# Patient Record
Sex: Male | Born: 1955 | Race: White | Hispanic: No | Marital: Married | Smoking: Current every day smoker
Health system: Southern US, Community
[De-identification: ages and names within clinical notes are randomized; demographics above are authoritative.]

## PROBLEM LIST (undated history)

## (undated) DIAGNOSIS — N402 Nodular prostate without lower urinary tract symptoms: Secondary | ICD-10-CM

## (undated) DIAGNOSIS — I10 Essential (primary) hypertension: Secondary | ICD-10-CM

## (undated) HISTORY — DX: Essential (primary) hypertension: I10

## (undated) HISTORY — DX: Nodular prostate without lower urinary tract symptoms: N40.2

---

## 2001-09-29 HISTORY — PX: OTHER SURGICAL HISTORY: SHX169

## 2005-09-29 HISTORY — PX: PROSTATE SURGERY: SHX751

## 2006-07-15 ENCOUNTER — Ambulatory Visit: Payer: Self-pay | Admitting: Internal Medicine

## 2006-07-31 ENCOUNTER — Ambulatory Visit: Payer: Self-pay | Admitting: Internal Medicine

## 2006-10-15 ENCOUNTER — Ambulatory Visit: Payer: Self-pay | Admitting: Internal Medicine

## 2006-12-02 ENCOUNTER — Ambulatory Visit: Payer: Self-pay | Admitting: Internal Medicine

## 2006-12-23 ENCOUNTER — Encounter: Payer: Self-pay | Admitting: Internal Medicine

## 2006-12-23 ENCOUNTER — Ambulatory Visit: Payer: Self-pay | Admitting: Internal Medicine

## 2007-01-19 ENCOUNTER — Ambulatory Visit: Payer: Self-pay | Admitting: Internal Medicine

## 2007-01-19 LAB — CONVERTED CEMR LAB
BUN: 14 mg/dL (ref 6–23)
CO2: 30 meq/L (ref 19–32)
Calcium: 9.7 mg/dL (ref 8.4–10.5)
Creatinine, Ser: 1 mg/dL (ref 0.4–1.5)
GFR calc Af Amer: 101 mL/min

## 2007-03-09 ENCOUNTER — Telehealth (INDEPENDENT_AMBULATORY_CARE_PROVIDER_SITE_OTHER): Payer: Self-pay | Admitting: *Deleted

## 2007-04-05 ENCOUNTER — Encounter (INDEPENDENT_AMBULATORY_CARE_PROVIDER_SITE_OTHER): Payer: Self-pay | Admitting: *Deleted

## 2007-05-12 ENCOUNTER — Telehealth (INDEPENDENT_AMBULATORY_CARE_PROVIDER_SITE_OTHER): Payer: Self-pay | Admitting: *Deleted

## 2007-05-26 ENCOUNTER — Ambulatory Visit: Payer: Self-pay | Admitting: Internal Medicine

## 2007-05-26 DIAGNOSIS — I10 Essential (primary) hypertension: Secondary | ICD-10-CM | POA: Insufficient documentation

## 2007-05-28 ENCOUNTER — Telehealth (INDEPENDENT_AMBULATORY_CARE_PROVIDER_SITE_OTHER): Payer: Self-pay | Admitting: *Deleted

## 2007-10-15 ENCOUNTER — Ambulatory Visit: Payer: Self-pay | Admitting: Internal Medicine

## 2007-10-15 DIAGNOSIS — J309 Allergic rhinitis, unspecified: Secondary | ICD-10-CM | POA: Insufficient documentation

## 2007-10-18 LAB — CONVERTED CEMR LAB
ALT: 27 units/L (ref 0–53)
AST: 19 units/L (ref 0–37)
BUN: 14 mg/dL (ref 6–23)
Basophils Absolute: 0 10*3/uL (ref 0.0–0.1)
Calcium: 9.9 mg/dL (ref 8.4–10.5)
Chloride: 101 meq/L (ref 96–112)
Cholesterol: 211 mg/dL (ref 0–200)
Direct LDL: 117.6 mg/dL
Eosinophils Absolute: 0.3 10*3/uL (ref 0.0–0.6)
Eosinophils Relative: 4.7 % (ref 0.0–5.0)
GFR calc Af Amer: 90 mL/min
GFR calc non Af Amer: 74 mL/min
Glucose, Bld: 105 mg/dL — ABNORMAL HIGH (ref 70–99)
HDL: 59.8 mg/dL (ref >39.0)
Lymphocytes Relative: 30.4 % (ref 12.0–46.0)
MCV: 89.1 fL (ref 78.0–100.0)
Monocytes Relative: 7.6 % (ref 3.0–11.0)
Neutro Abs: 4 10*3/uL (ref 1.4–7.7)
Platelets: 205 10*3/uL (ref 150–400)
RBC: 5.01 M/uL (ref 4.22–5.81)
Triglycerides: 155 mg/dL — ABNORMAL HIGH (ref 0–149)
VLDL: 31 mg/dL (ref 0–40)
WBC: 6.9 10*3/uL (ref 4.5–10.5)

## 2008-01-31 ENCOUNTER — Encounter (INDEPENDENT_AMBULATORY_CARE_PROVIDER_SITE_OTHER): Payer: Self-pay | Admitting: *Deleted

## 2008-03-20 ENCOUNTER — Telehealth (INDEPENDENT_AMBULATORY_CARE_PROVIDER_SITE_OTHER): Payer: Self-pay | Admitting: *Deleted

## 2008-03-21 ENCOUNTER — Encounter: Payer: Self-pay | Admitting: Internal Medicine

## 2008-03-22 ENCOUNTER — Ambulatory Visit: Payer: Self-pay | Admitting: Internal Medicine

## 2008-03-22 DIAGNOSIS — M109 Gout, unspecified: Secondary | ICD-10-CM | POA: Insufficient documentation

## 2008-03-27 LAB — CONVERTED CEMR LAB: Uric Acid, Serum: 8.2 mg/dL — ABNORMAL HIGH (ref 4.0–7.8)

## 2008-12-27 ENCOUNTER — Encounter (INDEPENDENT_AMBULATORY_CARE_PROVIDER_SITE_OTHER): Payer: Self-pay | Admitting: *Deleted

## 2008-12-27 ENCOUNTER — Ambulatory Visit: Payer: Self-pay | Admitting: Internal Medicine

## 2009-01-04 LAB — CONVERTED CEMR LAB
ALT: 25 units/L (ref 0–53)
Alkaline Phosphatase: 50 units/L (ref 39–117)
BUN: 10 mg/dL (ref 6–23)
Basophils Relative: 0.8 % (ref 0.0–3.0)
Bilirubin, Direct: 0.2 mg/dL (ref 0.0–0.3)
Calcium: 9.6 mg/dL (ref 8.4–10.5)
Chloride: 100 meq/L (ref 96–112)
Creatinine, Ser: 0.8 mg/dL (ref 0.4–1.5)
Eosinophils Relative: 3.5 % (ref 0.0–5.0)
GFR calc non Af Amer: 106.93 mL/min (ref >60)
HDL: 56.5 mg/dL (ref >39.00)
LDL Cholesterol: 110 mg/dL — ABNORMAL HIGH (ref 0–99)
Lymphocytes Relative: 28.5 % (ref 12.0–46.0)
MCV: 89.1 fL (ref 78.0–100.0)
Monocytes Absolute: 0.6 10*3/uL (ref 0.1–1.0)
Neutrophils Relative %: 59.8 % (ref 43.0–77.0)
Platelets: 221 10*3/uL (ref 150.0–400.0)
RBC: 4.99 M/uL (ref 4.22–5.81)
TSH: 1.18 microintl units/mL (ref 0.35–5.50)
Total Bilirubin: 0.9 mg/dL (ref 0.3–1.2)
Total CHOL/HDL Ratio: 4
Triglycerides: 156 mg/dL — ABNORMAL HIGH (ref 0.0–149.0)
WBC: 7.5 10*3/uL (ref 4.5–10.5)

## 2009-01-10 ENCOUNTER — Telehealth (INDEPENDENT_AMBULATORY_CARE_PROVIDER_SITE_OTHER): Payer: Self-pay | Admitting: *Deleted

## 2009-01-31 ENCOUNTER — Ambulatory Visit: Payer: Self-pay | Admitting: Internal Medicine

## 2009-03-12 ENCOUNTER — Ambulatory Visit: Payer: Self-pay | Admitting: Internal Medicine

## 2009-03-12 DIAGNOSIS — M79609 Pain in unspecified limb: Secondary | ICD-10-CM | POA: Insufficient documentation

## 2009-03-13 ENCOUNTER — Telehealth (INDEPENDENT_AMBULATORY_CARE_PROVIDER_SITE_OTHER): Payer: Self-pay | Admitting: *Deleted

## 2010-02-12 ENCOUNTER — Telehealth (INDEPENDENT_AMBULATORY_CARE_PROVIDER_SITE_OTHER): Payer: Self-pay | Admitting: *Deleted

## 2010-03-08 ENCOUNTER — Ambulatory Visit: Payer: Self-pay | Admitting: Internal Medicine

## 2010-03-11 ENCOUNTER — Encounter (INDEPENDENT_AMBULATORY_CARE_PROVIDER_SITE_OTHER): Payer: Self-pay | Admitting: *Deleted

## 2010-03-26 ENCOUNTER — Encounter (INDEPENDENT_AMBULATORY_CARE_PROVIDER_SITE_OTHER): Payer: Self-pay | Admitting: *Deleted

## 2010-03-27 ENCOUNTER — Ambulatory Visit: Payer: Self-pay | Admitting: Internal Medicine

## 2010-03-29 LAB — CONVERTED CEMR LAB
AST: 20 units/L (ref 0–37)
Basophils Relative: 0.7 % (ref 0.0–3.0)
CO2: 31 meq/L (ref 19–32)
Chloride: 105 meq/L (ref 96–112)
Cholesterol: 204 mg/dL — ABNORMAL HIGH (ref 0–200)
Eosinophils Absolute: 0.3 10*3/uL (ref 0.0–0.7)
Glucose, Bld: 100 mg/dL — ABNORMAL HIGH (ref 70–99)
HCT: 44.8 % (ref 39.0–52.0)
Hemoglobin: 15.3 g/dL (ref 13.0–17.0)
Lymphs Abs: 2.3 10*3/uL (ref 0.7–4.0)
MCHC: 34.1 g/dL (ref 30.0–36.0)
MCV: 91.6 fL (ref 78.0–100.0)
Monocytes Absolute: 0.5 10*3/uL (ref 0.1–1.0)
Neutro Abs: 3.8 10*3/uL (ref 1.4–7.7)
RBC: 4.9 M/uL (ref 4.22–5.81)
Sodium: 143 meq/L (ref 135–145)
Total CHOL/HDL Ratio: 3

## 2010-04-16 ENCOUNTER — Encounter (INDEPENDENT_AMBULATORY_CARE_PROVIDER_SITE_OTHER): Payer: Self-pay | Admitting: *Deleted

## 2010-04-19 ENCOUNTER — Telehealth (INDEPENDENT_AMBULATORY_CARE_PROVIDER_SITE_OTHER): Payer: Self-pay | Admitting: *Deleted

## 2010-10-27 LAB — CONVERTED CEMR LAB
ALT: 36 units/L (ref 0–40)
AST: 25 units/L (ref 0–37)
BUN: 10 mg/dL (ref 6–23)
Calcium: 10 mg/dL (ref 8.4–10.5)
Chloride: 102 meq/L (ref 96–112)
HCT: 46.6 % (ref 39.0–52.0)
Hemoglobin: 15.9 g/dL (ref 13.0–17.0)
MCHC: 34.1 g/dL (ref 30.0–36.0)
MCV: 91.1 fL (ref 78.0–100.0)
Sodium: 139 meq/L (ref 135–145)
TSH: 1.11 microintl units/mL (ref 0.35–5.50)
Total Bilirubin: 1.1 mg/dL (ref 0.3–1.2)
Total Protein: 6.9 g/dL (ref 6.0–8.3)
VLDL: 32 mg/dL (ref 0–40)
WBC: 5.9 10*3/uL (ref 4.5–10.5)

## 2010-10-29 NOTE — Letter (Signed)
Summary: [facility] Instructions  [facility]  [address] [place], [place] [address]   Phone: [phone]  Fax: [phone]       BRONTE KROPF    [birth year]    MRN: [id number]        Procedure Day Dorna Bloom:  Farrell Ours  05/03/10     Arrival Time:  9:00am     Procedure Time:  10:00am     Location of Procedure:                    Juliann Pares _  [facility] (4th Floor)                        PREPARATION FOR COLONOSCOPY WITH MOVIPREP   Starting 5 days prior to your procedure  SUNDAY 07/31  do not eat nuts, seeds, popcorn, corn, beans, peas,  salads, or any raw vegetables.  Do not take any fiber supplements (e.g. Metamucil, Citrucel, and Benefiber).  THE DAY BEFORE YOUR PROCEDURE         DATE: THURSDAY  08/04  1.  Drink clear liquids the entire day-NO SOLID FOOD  2.  Do not drink anything colored red or purple.  Avoid juices with pulp.  No orange juice.  3.  Drink at least 64 oz. (8 glasses) of fluid/clear liquids during the day to prevent dehydration and help the prep work efficiently.  CLEAR LIQUIDS INCLUDE: Water Jello Ice Popsicles Tea (sugar ok, no milk/cream) Powdered fruit flavored drinks Coffee (sugar ok, no milk/cream) Gatorade Juice: apple, white grape, white cranberry  Lemonade Clear bullion, consomm, broth Carbonated beverages (any kind) Strained chicken noodle soup Hard Candy                             4.  In the morning, mix first dose of MoviPrep solution:    Empty 1 Pouch A and 1 Pouch B into the disposable container    Add lukewarm drinking water to the top line of the container. Mix to dissolve    Refrigerate (mixed solution should be used within 24 hrs)  5.  Begin drinking the prep at 5:00 p.m. The MoviPrep container is divided by 4 marks.   Every 15 minutes drink the solution down to the next mark (approximately 8 oz) until the full liter is complete.   6.  Follow completed prep with 16 oz of clear liquid of your choice  (Nothing red or purple).  Continue to drink clear liquids until bedtime.  7.  Before going to bed, mix second dose of MoviPrep solution:    Empty 1 Pouch A and 1 Pouch B into the disposable container    Add lukewarm drinking water to the top line of the container. Mix to dissolve    Refrigerate  THE DAY OF YOUR PROCEDURE      DATE: FRIDAY  08/05  Beginning at  5:00 a.m. (5 hours before procedure):         1. Every 15 minutes, drink the solution down to the next mark (approx 8 oz) until the full liter is complete.  2. Follow completed prep with 16 oz. of clear liquid of your choice.    3. You may drink clear liquids until 8:00am (2 HOURS BEFORE PROCEDURE).   MEDICATION INSTRUCTIONS  Unless otherwise instructed, you should take regular prescription medications with a small sip of water   as early as  possible the morning of your procedure.  Diabetic patients - see separate instructions.  Stop taking Plavix or Aggrenox on  _  _  (7 days before procedure).     Stop taking Coumadin on  _ _  (5 days before procedure).  Additional medication instructions: _         OTHER INSTRUCTIONS  You will need a responsible adult at least 57 years of age to accompany you and drive you home.   This person must remain in the waiting room during your procedure.  Wear loose fitting clothing that is easily removed.  Leave jewelry and other valuables at home.  However, you may wish to bring a book to read or  an iPod/MP3 player to listen to music as you wait for your procedure to start.  Remove all body piercing jewelry and leave at home.  Total time from sign-in until discharge is approximately 2-3 hours.  You should go home directly after your procedure and rest.  You can resume normal activities the  day after your procedure.  The day of your procedure you should not:   Drive   Make legal decisions   Operate machinery   Drink alcohol   Return to work  You will receive  specific instructions about eating, activities and medications before you leave.    The above instructions have been reviewed and explained to me by  Patient was a NOS. _______________________Suzanne Yetta Flock, RN    I fully understand and can verbalize these instructions _____________________________ Date _________

## 2010-10-29 NOTE — Letter (Signed)
Summary: Primary Care Consult Scheduled Letter  [facility]  [address] [place], [place] [address]   Phone: [phone]  Fax: [phone]      03/26/2010 MRN: [id number]  Clifford Dean [address] [place], [place]  [address]    Dear [name],      We have scheduled an appointment for you.  At the recommendation of Dr.Paz, we have scheduled you a consult with [facility] located on [address], [place], [place] on the 3rd floor. Your previsit with the nurse will be on 04-19-10 at 9:30am. The actual procedure will be on 05-03-10 @ 10am. You will need to arrive 1 hour early on this day. The doctor will be Dr. Russella Dar. If this appointment day and time is not convenient for you, please feel free to call the office at [phone] reschedule the appointment.     It is important for you to keep your scheduled appointments. We are here to make sure you are given good patient care. If you have questions or you have made changes to your appointment, please notify us at  [phone] ext [phone] , ask for Marisue Ivan.    Thank you,  Patient Care Coordinator [place] [facility]

## 2010-10-29 NOTE — Assessment & Plan Note (Signed)
Summary: CPX/CBS   Vital Signs:  Patient profile:   55 year old male Height:      69 inches Weight:      206 pounds BMI:     30.53 Pulse rate:   56 / minute BP sitting:   136 / 86  (left arm)  Vitals Entered By: Doristine Devoid (March 08, 2010 3:04 PM) CC: CPX    History of Present Illness: CPX  Allergies: No Known Drug Allergies  Past History:  Past Medical History: Reviewed history from 03/22/2008 and no changes required. Hypertension Allergic rhinitis Cardiolite neg 2003 Gout  Past Surgical History: Reviewed history from 10/15/2007 and no changes required. Prostate nodule: Prostate Bx (-) 2007  Family History: Reviewed history from 10/15/2007 and no changes required. 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 CV:  Denies chest pain or discomfort, palpitations, and swelling of feet; ambulatory BP around 120-140/75-90. Resp:  Denies cough and wheezing. GI:  Denies bloody stools, diarrhea, nausea, and vomiting. Psych:  Denies anxiety and depression.  Physical Exam  General:  alert, well-developed, and well-nourished.   Neck:  no masses, no thyromegaly, and normal carotid upstroke.   Lungs:  normal respiratory effort, no intercostal retractions, no accessory muscle use, and normal breath sounds.   Heart:  normal rate, regular rhythm, no murmur, and no gallop.   Abdomen:  soft, non-tender, normal bowel sounds, no distention, no masses, no guarding, and no rigidity.   Extremities:  no lower extremity edema Psych:  Oriented X3, memory intact for recent and remote, normally interactive, good eye contact, not anxious appearing, and not depressed appearing.     Impression &  Recommendations:  Problem # 1:  HEALTH SCREENING (ICD-V70.0)  Td 2003 never had a Cscope, thinking about having a colonoscopy in few months. We'll arrange a referral to GI Diet and exercise discussed labs PSA per urology   Orders: Gastroenterology Referral (GI)  Problem # 2:  HYPERTENSION (ICD-401.9) off Maxide  for  few months due to a history of gout. Ambulatory BPs satisfactory so far The following medications were removed from the medication list:    Maxzide-25 37.5-25 Mg Tabs (Triamterene-hctz) ..... Half by mouth once daily His updated medication list for this problem includes:    Lopressor 100 Mg Tabs (Metoprolol tartrate) .Marland Kitchen... 1 by mouth two times a day    Felodipine 10 Mg Tb24 (Felodipine) .Marland Kitchen... 1 by mouth once daily  BP today: 136/86 Prior BP: 120/80 (03/12/2009)  Labs Reviewed: K+: 3.9 (12/27/2008) Creat: : 0.8 (12/27/2008)   Chol: 198 (12/27/2008)   HDL: 56.50 (12/27/2008)   LDL: 110 (12/27/2008)   TG: 156.0 (12/27/2008)  Complete Medication List: 1)  Lopressor 100 Mg Tabs (Metoprolol tartrate) .Marland Kitchen.. 1 by mouth two times a day 2)  Felodipine 10 Mg Tb24 (Felodipine) .Marland Kitchen.. 1 by mouth once daily 3)  Baby Aspirin 81 Mg Chew (Aspirin) .Marland Kitchen.. 1  a day 4)  Indomethacin 50 Mg Caps (Indomethacin) .Marland Kitchen.. 1 by mouth three times a day as needed gout - 5)  Locoid Lipocream 0.1 % Crea (Hydrocortisone butyr lipo base) .... Per derm  Patient Instructions: 1)  come back fasting 2)  FLP, BMP, CBC,TSH, AST, ALT, uric acid  Dx V70 3)  Check your blood pressure 2 or 3 times a week. If it is more than 140/85 consistently,please let us know  4)  Please schedule a follow-up appointment in 1 year.  Prescriptions: FELODIPINE 10 MG  TB24 (FELODIPINE) 1 by mouth once daily  #90 x 3   Entered and Authorized by:   Elita Quick E. Paz MD   Signed by:   Nolon Rod. Paz MD on 03/08/2010   Method used:   Electronically to        [facility]  [phone]* (retail)       [address] [place], [place]  [address]       Ph: [phone] or [phone]       Fax: [phone]   RxID:   [id number] LOPRESSOR 100 MG  TABS (METOPROLOL TARTRATE) 1 by mouth two times a day  #180 x 3   Entered and Authorized by:   Nolon Rod. Paz MD   Signed by:   Nolon Rod. Paz MD on 03/08/2010   Method used:   Electronically to        [facility]  [phone]* (retail)       [address] [place], [place]  [address]       Ph: [phone] or [phone]       Fax: [phone]   RxID:   [id number]

## 2010-10-29 NOTE — Letter (Signed)
Summary: Previsit letter  [facility]  [address] [place], [place] [address]   Phone: [phone]  Fax: [phone]       03/11/2010 MRN: [id number]  Clifford Dean [address] [place], [place]  [address]  Dear Mr. CUARESMA,  Welcome to the Gastroenterology Division at [facility].    You are scheduled to see a nurse for your pre-procedure visit on 04-19-10 at 9:30am on the 3rd floor at [facility], [address]  We ask that you try to arrive at our office 15 minutes prior to your appointment time to allow for check-in.  Your nurse visit will consist of discussing your medical and surgical history, your immediate family medical history, and your medications.    Please bring a complete list of all your medications or, if you prefer, bring the medication bottles and we will list them.  We will need to be aware of both prescribed and over the counter drugs.  We will need to know exact dosage information as well.  If you are on blood thinners (Coumadin, Plavix, Aggrenox, Ticlid, etc.) please call our office today/prior to your appointment, as we need to consult with your physician about holding your medication.   Please be prepared to read and sign documents such as consent forms, a financial agreement, and acknowledgement forms.  If necessary, and with your consent, a friend or relative is welcome to sit-in on the nurse visit with you.  Please bring your insurance card so that we may make a copy of it.  If your insurance requires a referral to see a specialist, please bring your referral form from your primary care physician.  No co-pay is required for this nurse visit.     If you cannot keep your appointment, please call [phone] to cancel or reschedule prior to your appointment date.  This allows [place] the opportunity to schedule an appointment for another patient in need of care.    Thank you for choosing [facility] for your medical needs.  We  appreciate the opportunity to care for you.  Please visit [place] at our website  to learn more about our practice.                     Sincerely.                                                                                                                   The Gastroenterology Division

## 2010-10-29 NOTE — Progress Notes (Signed)
Summary: FYI -pt has appt for cpx [id number]  Phone Note Call from Patient   Summary of Call: PATIENT HAS APPT FOR CPX - [id number] Initial call taken by: Okey Regal [place],  Feb 12, 2010 9:01 AM

## 2010-10-29 NOTE — Progress Notes (Signed)
  Phone Note Outgoing Call   Call placed by: Clide Cliff RN,  April 19, 2010 10:01 AM Details for Reason: NOS for previsit Summary of Call: Called pt. on cell phone and left a message to call me if he wanted to reschedule his previsit.  Informed him that I would have to cancel his colonoscopy if a previsit was not completed.  Spoke with wife at 4:00pm and she said to "just cancel it.  He'll call back when he's ready." Initial call taken by: Clide Cliff RN,  April 19, 2010 10:02 AM

## 2011-03-04 ENCOUNTER — Other Ambulatory Visit: Payer: Self-pay | Admitting: Internal Medicine

## 2011-03-05 ENCOUNTER — Other Ambulatory Visit: Payer: Self-pay | Admitting: Internal Medicine

## 2011-03-11 ENCOUNTER — Encounter: Payer: Self-pay | Admitting: Internal Medicine

## 2011-03-12 ENCOUNTER — Ambulatory Visit (INDEPENDENT_AMBULATORY_CARE_PROVIDER_SITE_OTHER): Payer: BC Managed Care – PPO | Admitting: Internal Medicine

## 2011-03-12 ENCOUNTER — Encounter: Payer: Self-pay | Admitting: Internal Medicine

## 2011-03-12 DIAGNOSIS — M109 Gout, unspecified: Secondary | ICD-10-CM

## 2011-03-12 DIAGNOSIS — Z0000 Encounter for general adult medical examination without abnormal findings: Secondary | ICD-10-CM | POA: Insufficient documentation

## 2011-03-12 DIAGNOSIS — I10 Essential (primary) hypertension: Secondary | ICD-10-CM

## 2011-03-12 LAB — CBC WITH DIFFERENTIAL/PLATELET
Basophils Absolute: 0.1 10*3/uL (ref 0.0–0.1)
Eosinophils Absolute: 0.4 10*3/uL (ref 0.0–0.7)
HCT: 44.8 % (ref 39.0–52.0)
Lymphs Abs: 2.6 10*3/uL (ref 0.7–4.0)
MCV: 91.5 fl (ref 78.0–100.0)
Monocytes Absolute: 0.5 10*3/uL (ref 0.1–1.0)
Neutrophils Relative %: 51.5 % (ref 43.0–77.0)
Platelets: 218 10*3/uL (ref 150.0–400.0)
RDW: 13.5 % (ref 11.5–14.6)

## 2011-03-12 LAB — LDL CHOLESTEROL, DIRECT: Direct LDL: 122.5 mg/dL

## 2011-03-12 LAB — AST: AST: 25 U/L (ref 0–37)

## 2011-03-12 LAB — LIPID PANEL
Cholesterol: 209 mg/dL — ABNORMAL HIGH (ref 0–200)
HDL: 60.7 mg/dL (ref >39.00)
Total CHOL/HDL Ratio: 3
Triglycerides: 157 mg/dL — ABNORMAL HIGH (ref 0.0–149.0)

## 2011-03-12 LAB — BASIC METABOLIC PANEL
Calcium: 9.7 mg/dL (ref 8.4–10.5)
Creatinine, Ser: 0.8 mg/dL (ref 0.4–1.5)
GFR: 101.66 mL/min (ref >60.00)
Sodium: 139 mEq/L (ref 135–145)

## 2011-03-12 LAB — TSH: TSH: 1.26 u[IU]/mL (ref 0.35–5.50)

## 2011-03-12 LAB — URIC ACID: Uric Acid, Serum: 8.2 mg/dL — ABNORMAL HIGH (ref 4.0–7.8)

## 2011-03-12 MED ORDER — METOPROLOL TARTRATE 100 MG PO TABS: 100.0000 mg | ORAL_TABLET | Freq: Two times a day (BID) | ORAL | Status: DC

## 2011-03-12 MED ORDER — FELODIPINE ER 10 MG PO TB24: 10.0000 mg | ORAL_TABLET | Freq: Every day | ORAL | Status: DC

## 2011-03-12 MED ORDER — COLCHICINE 0.6 MG PO TABS: ORAL_TABLET | ORAL | Status: DC

## 2011-03-12 NOTE — Assessment & Plan Note (Addendum)
See above, episodes q 2 weeks, good respond  to indocin but has some dyspepsia , no abd pain. We discussed initiation allopurinol, pt not really interested. States that most of his episodes are related to dietary indiscretion, seafood.  rec to try colchicine instead of NSAIDs to avoid GI sx

## 2011-03-12 NOTE — Assessment & Plan Note (Addendum)
Td 2003 Couldn't have a cscope last year d/t insurance issues, re-referr to GI Diet and exercise discussed labs PSA per urology

## 2011-03-12 NOTE — Assessment & Plan Note (Signed)
EKG sinus brady, pt on BB, denies sx c/w brady. No change for now

## 2011-03-12 NOTE — Progress Notes (Signed)
  Subjective:    Patient ID: Clifford Dean, male    DOB: 07/23/1956, 55 y.o.   MRN: [id number]  HPI  CPX, doing well , no problems since last OV 2011  Past Medical History  Diagnosis Date  . Hypertension   . Allergic rhinitis   . Gout   . Prostate nodule      Bx (-) 2007   Past Surgical History  Procedure Date  . Prostate surgery 2007    prosate biopsy (-)   . Cardiolite 2003    negative     Family History: colon ca--no prostate ca--no HTN-- mother glaucoma--?mom MI--no DM--no  Social History: Married 3 children Has a great  job  exercise--  Very active, walks when playing golf diet-- is "ok", has gained some weight, eats out a lot tobacco-- occasionally, 1 pack/week  ETOH-- wine at night    Review of Systems  Constitutional: Negative for fever and unexpected weight change.  Respiratory: Negative for cough and shortness of breath.   Cardiovascular: Negative for chest pain, palpitations and leg swelling.  Gastrointestinal: Negative for nausea, vomiting, abdominal pain, diarrhea and blood in stool.  Musculoskeletal:       H/o gout , occ aches at great toe and wrist, q 2 weeks, usually related to diet , indocin x 4-6 tablets take care of the problem. He does have some dyspepsia when he takes in the cecum but no abdominal pain or blood in the stools       Objective:   Physical Exam  Constitutional: He is oriented to person, place, and time. He appears well-developed and well-nourished. No distress.  HENT:  Head: Normocephalic and atraumatic.  Neck: No thyromegaly present.  Cardiovascular: Normal rate, regular rhythm and normal heart sounds.   No murmur heard. Pulmonary/Chest: Effort normal and breath sounds normal. No respiratory distress. He has no wheezes. He has no rales.  Abdominal: Soft. Bowel sounds are normal. He exhibits no distension. There is no tenderness. There is no rebound and no guarding.  Musculoskeletal: He exhibits no edema.  Neurological:  He is alert and oriented to person, place, and time.  Skin: Skin is warm and dry.  Psychiatric: He has a normal mood and affect. His behavior is normal. Judgment and thought content normal.          Assessment & Plan:

## 2011-03-12 NOTE — Patient Instructions (Signed)
For gout: diet!, try colchicine instead of indomethacine. If you take indomethacine watch for stomach pain and change in the color of stools (ulcer)

## 2011-03-13 ENCOUNTER — Encounter: Payer: Self-pay | Admitting: Gastroenterology

## 2011-03-13 ENCOUNTER — Encounter: Payer: Self-pay | Admitting: Internal Medicine

## 2011-03-14 ENCOUNTER — Telehealth: Payer: Self-pay | Admitting: *Deleted

## 2011-03-14 ENCOUNTER — Encounter: Payer: Self-pay | Admitting: Internal Medicine

## 2011-03-14 NOTE — Telephone Encounter (Signed)
Message copied by Leanne Lovely on Fri Mar 14, 2011  4:45 PM ------      Message from: Willow Ora E      Created: Fri Mar 14, 2011  4:32 PM       Advise patient:      The blood sugar is slightly elevated, please add a hemoglobin A1c.      His cholesterol is good, it used to be even better. Recommend to watch diet and exercise.      Uric acid, the gout test, is slightly high, watch diet.      Rest of the labs normal

## 2011-03-14 NOTE — Telephone Encounter (Signed)
Message left for patient to return my call. Sent over lab add on.

## 2011-03-17 NOTE — Telephone Encounter (Signed)
Message left for patient to return my call.  

## 2011-03-18 ENCOUNTER — Telehealth: Payer: Self-pay | Admitting: *Deleted

## 2011-03-18 DIAGNOSIS — E785 Hyperlipidemia, unspecified: Secondary | ICD-10-CM

## 2011-03-18 DIAGNOSIS — R739 Hyperglycemia, unspecified: Secondary | ICD-10-CM

## 2011-03-18 NOTE — Telephone Encounter (Signed)
Message left for patient to return my call.  

## 2011-03-18 NOTE — Telephone Encounter (Signed)
Message copied by Leanne Lovely on Tue Mar 18, 2011 11:39 AM ------      Message from: Willow Ora E      Created: Mon Mar 17, 2011  6:19 PM       Advise patient:      He does have borderline DM; no need for meds, diet-exercise-wt loss is needed      Please arrange a nutritionist referal      RTC 3 months instead of 6

## 2011-03-19 ENCOUNTER — Telehealth: Payer: Self-pay | Admitting: *Deleted

## 2011-03-19 NOTE — Telephone Encounter (Signed)
Spoke w/ pt aware of instructions 

## 2011-03-19 NOTE — Telephone Encounter (Signed)
Pt is aware.  

## 2011-03-19 NOTE — Telephone Encounter (Signed)
Message left for patient to return my call.  

## 2011-03-19 NOTE — Telephone Encounter (Signed)
Addended by: Doristine Devoid on: 03/19/2011 09:19 AM   Modules accepted: Orders

## 2011-04-08 ENCOUNTER — Other Ambulatory Visit: Payer: BC Managed Care – PPO | Admitting: Gastroenterology

## 2011-05-19 ENCOUNTER — Emergency Department (HOSPITAL_COMMUNITY)
Admission: EM | Admit: 2011-05-19 | Discharge: 2011-05-19 | Disposition: A | Discharge status: Home / Self Care | Payer: BC Managed Care – PPO | Attending: Emergency Medicine | Admitting: Emergency Medicine

## 2011-05-19 DIAGNOSIS — I10 Essential (primary) hypertension: Secondary | ICD-10-CM | POA: Insufficient documentation

## 2011-05-19 DIAGNOSIS — M109 Gout, unspecified: Secondary | ICD-10-CM | POA: Insufficient documentation

## 2011-05-19 DIAGNOSIS — R5381 Other malaise: Secondary | ICD-10-CM | POA: Insufficient documentation

## 2011-05-19 DIAGNOSIS — M7989 Other specified soft tissue disorders: Secondary | ICD-10-CM | POA: Insufficient documentation

## 2011-05-19 DIAGNOSIS — R5383 Other fatigue: Secondary | ICD-10-CM | POA: Insufficient documentation

## 2011-05-19 LAB — DIFFERENTIAL
Basophils Absolute: 0 10*3/uL (ref 0.0–0.1)
Basophils Relative: 0 % (ref 0–1)
Lymphocytes Relative: 21 % (ref 12–46)
Monocytes Absolute: 0.8 10*3/uL (ref 0.1–1.0)
Neutro Abs: 6.7 10*3/uL (ref 1.7–7.7)
Neutrophils Relative %: 68 % (ref 43–77)

## 2011-05-19 LAB — CBC
HCT: 42.8 % (ref 39.0–52.0)
Hemoglobin: 14.6 g/dL (ref 13.0–17.0)
RBC: 4.88 MIL/uL (ref 4.22–5.81)
WBC: 9.9 10*3/uL (ref 4.0–10.5)

## 2011-05-19 LAB — URIC ACID: Uric Acid, Serum: 8.4 mg/dL — ABNORMAL HIGH (ref 4.0–7.8)

## 2011-05-19 LAB — COMPREHENSIVE METABOLIC PANEL
ALT: 25 U/L (ref 0–53)
Alkaline Phosphatase: 82 U/L (ref 39–117)
BUN: 13 mg/dL (ref 6–23)
CO2: 24 mEq/L (ref 19–32)
Chloride: 99 mEq/L (ref 96–112)
GFR calc Af Amer: >60 mL/min (ref >60)
Glucose, Bld: 103 mg/dL — ABNORMAL HIGH (ref 70–99)
Potassium: 3.6 mEq/L (ref 3.5–5.1)
Sodium: 134 mEq/L — ABNORMAL LOW (ref 135–145)
Total Bilirubin: 0.6 mg/dL (ref 0.3–1.2)

## 2011-05-19 LAB — POCT I-STAT TROPONIN I: Troponin i, poc: 0 ng/mL (ref 0.00–0.08)

## 2011-05-19 LAB — URINALYSIS, ROUTINE W REFLEX MICROSCOPIC
Bilirubin Urine: NEGATIVE
Hgb urine dipstick: NEGATIVE
Nitrite: NEGATIVE
Protein, ur: NEGATIVE mg/dL
Specific Gravity, Urine: 1.011 (ref 1.005–1.030)
Urobilinogen, UA: 0.2 mg/dL (ref 0.0–1.0)

## 2012-03-25 ENCOUNTER — Other Ambulatory Visit: Payer: Self-pay | Admitting: Internal Medicine

## 2012-03-26 NOTE — Telephone Encounter (Signed)
30-day supply given [last OV 06.13.12]/SLS *PATIENT DUE FOR OFFICE VISIT-NO FURTHER REFILL AUTHORIZATIONS WITHOUT MD VISIT PRIOR*

## 2012-04-23 ENCOUNTER — Ambulatory Visit: Payer: BC Managed Care – PPO | Admitting: Internal Medicine

## 2012-04-26 ENCOUNTER — Encounter: Payer: Self-pay | Admitting: Internal Medicine

## 2012-04-26 ENCOUNTER — Ambulatory Visit (INDEPENDENT_AMBULATORY_CARE_PROVIDER_SITE_OTHER): Payer: BC Managed Care – PPO | Admitting: Internal Medicine

## 2012-04-26 VITALS — BP 136/82 | HR 52 | Temp 98.3°F | Wt 216.0 lb

## 2012-04-26 DIAGNOSIS — R7309 Other abnormal glucose: Secondary | ICD-10-CM

## 2012-04-26 DIAGNOSIS — R739 Hyperglycemia, unspecified: Secondary | ICD-10-CM | POA: Insufficient documentation

## 2012-04-26 DIAGNOSIS — I10 Essential (primary) hypertension: Secondary | ICD-10-CM

## 2012-04-26 DIAGNOSIS — M109 Gout, unspecified: Secondary | ICD-10-CM

## 2012-04-26 LAB — BASIC METABOLIC PANEL
BUN: 13 mg/dL (ref 6–23)
Calcium: 10 mg/dL (ref 8.4–10.5)
Creatinine, Ser: 0.9 mg/dL (ref 0.4–1.5)
GFR: 89.91 mL/min (ref >60.00)
Glucose, Bld: 96 mg/dL (ref 70–99)
Potassium: 4.2 mEq/L (ref 3.5–5.1)

## 2012-04-26 LAB — HEMOGLOBIN A1C: Hgb A1c MFr Bld: 5.3 % (ref 4.6–6.5)

## 2012-04-26 MED ORDER — FELODIPINE ER 10 MG PO TB24: 10.0000 mg | ORAL_TABLET | Freq: Every day | ORAL | Status: DC

## 2012-04-26 MED ORDER — METOPROLOL TARTRATE 100 MG PO TABS: 100.0000 mg | ORAL_TABLET | Freq: Two times a day (BID) | ORAL | Status: DC

## 2012-04-26 MED ORDER — COLCHICINE 0.6 MG PO TABS: ORAL_TABLET | ORAL | Status: DC

## 2012-04-26 NOTE — Assessment & Plan Note (Signed)
A1c last year 5.9. Discussed diet and exercise, recheck A1c.

## 2012-04-26 NOTE — Assessment & Plan Note (Signed)
Ambulatory BPs 130/80 on average, good BP control. Check a BMP

## 2012-04-26 NOTE — Progress Notes (Signed)
  Subjective:    Patient ID: Clifford Dean, male    DOB: [birth year], 56 y.o.   MRN: [id number]  HPI Routine office visit Hypertension, good medication compliance, ambulatory blood pressures are 130/80. Gout, on colchicine as needed, not taking Indocin. Usually can feel gout coming in and takes medication immediately aborting severe attacks   Past Medical History  Diagnosis Date  . Hypertension   . Allergic rhinitis   . Gout   . Prostate nodule      Bx (-) 2007     Family History: colon ca--no prostate ca--no HTN-- mother glaucoma--?mom MI--no DM--no  Social History: Married, 3 children Has a great  job   tobacco-- occasionally, 1 pack/week   ETOH-- wine at night    Review of Systems No chest pain or shortness or breath Normal extremity edema In the last 4 weeks, his diet has a improve and has lost a few pounds.    Objective:   Physical Exam  General -- alert, well-developed Lungs -- normal respiratory effort, no intercostal retractions, no accessory muscle use, and normal breath sounds.   Heart-- normal rate, regular rhythm, no murmur, and no gallop.   Extremities-- trace pretibial edema bilaterally Neurologic-- alert & oriented X3 and strength normal in all extremities. Psych-- Cognition and judgment appear intact. Alert and cooperative with normal attention span and concentration.  not anxious appearing and not depressed appearing.      Assessment & Plan:

## 2012-04-26 NOTE — Assessment & Plan Note (Addendum)
Well-controlled, uses colcrys as needed. Not using indocin at present

## 2012-04-26 NOTE — Patient Instructions (Addendum)
Return in few months as planned for a physical exam, fasting

## 2012-06-23 ENCOUNTER — Ambulatory Visit (INDEPENDENT_AMBULATORY_CARE_PROVIDER_SITE_OTHER): Payer: BC Managed Care – PPO | Admitting: Internal Medicine

## 2012-06-23 ENCOUNTER — Encounter: Payer: Self-pay | Admitting: Internal Medicine

## 2012-06-23 VITALS — BP 144/82 | HR 57 | Temp 98.1°F | Ht 70.0 in | Wt 212.0 lb

## 2012-06-23 DIAGNOSIS — Z23 Encounter for immunization: Secondary | ICD-10-CM

## 2012-06-23 DIAGNOSIS — Z0000 Encounter for general adult medical examination without abnormal findings: Secondary | ICD-10-CM

## 2012-06-23 LAB — CBC WITH DIFFERENTIAL/PLATELET
Basophils Absolute: 0.1 10*3/uL (ref 0.0–0.1)
Basophils Relative: 0.7 % (ref 0.0–3.0)
Eosinophils Relative: 4 % (ref 0.0–5.0)
HCT: 48.4 % (ref 39.0–52.0)
Hemoglobin: 15.9 g/dL (ref 13.0–17.0)
Lymphocytes Relative: 33.1 % (ref 12.0–46.0)
Lymphs Abs: 2.6 10*3/uL (ref 0.7–4.0)
Monocytes Relative: 9 % (ref 3.0–12.0)
Neutro Abs: 4.1 10*3/uL (ref 1.4–7.7)
RBC: 5.22 Mil/uL (ref 4.22–5.81)
RDW: 13.8 % (ref 11.5–14.6)
WBC: 7.7 10*3/uL (ref 4.5–10.5)

## 2012-06-23 LAB — LIPID PANEL
Cholesterol: 223 mg/dL — ABNORMAL HIGH (ref 0–200)
Total CHOL/HDL Ratio: 3
VLDL: 33.4 mg/dL (ref 0.0–40.0)

## 2012-06-23 MED ORDER — CLOTRIMAZOLE-BETAMETHASONE 1-0.05 % EX LOTN: TOPICAL_LOTION | Freq: Two times a day (BID) | CUTANEOUS | Status: DC

## 2012-06-23 MED ORDER — INDOMETHACIN 50 MG PO CAPS: 50.0000 mg | ORAL_CAPSULE | Freq: Three times a day (TID) | ORAL | Status: DC | PRN

## 2012-06-23 NOTE — Progress Notes (Signed)
  Subjective:    Patient ID: Clifford Dean, male    DOB: August 02, 1956, 56 y.o.   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 No nausea, vomiting, diarrhea. Occasionally has a rash in the groin, no itching. Loss mom few months ago, fortunately he is doing well emotionally.     Objective:   Physical Exam  Skin:      General -- alert, well-developed, and slt overweight appearing. No apparent distress.  Neck --no LADs, normal carotid pulse  Lungs -- normal respiratory effort, no intercostal retractions, no accessory muscle use, and normal breath sounds.   Heart-- normal rate, regular rhythm, no murmur, and no gallop.   Abdomen--soft, non-tender, no distention, no masses, no HSM, no guarding, and no rigidity.   Extremities-- no pretibial edema bilaterally  Neurologic-- alert & oriented X3 and strength normal in all extremities. Psych-- Cognition and judgment appear intact. Alert and cooperative with normal attention span and concentration.  not anxious appearing and not depressed appearing.        Assessment & Plan:

## 2012-06-23 NOTE — Assessment & Plan Note (Signed)
Td 2003 and today Declined a flu shot, benefits discussed  Never got to do a cscope , difference between a colonoscopy and Hemoccults discuss, provided an iFOB but will call if interested in a colonoscopy labs PSA per urology , plans to see him soon Has a groin rash, prescribe Lotrisone, plans to see dermatology if not better

## 2012-06-23 NOTE — Patient Instructions (Signed)
Check the  blood pressure 2 or 3 times a week, be sure it is between 110/60 and 140/80. If it is consistently higher or lower, let me know  

## 2012-11-13 ENCOUNTER — Other Ambulatory Visit: Payer: Self-pay

## 2012-12-21 ENCOUNTER — Ambulatory Visit: Payer: BC Managed Care – PPO | Admitting: Internal Medicine

## 2012-12-21 DIAGNOSIS — Z0289 Encounter for other administrative examinations: Secondary | ICD-10-CM

## 2013-04-29 ENCOUNTER — Other Ambulatory Visit: Payer: Self-pay | Admitting: Internal Medicine

## 2013-04-29 NOTE — Telephone Encounter (Signed)
Refill done for one month per protocol. scheduled for annual exam.

## 2013-05-13 ENCOUNTER — Ambulatory Visit (INDEPENDENT_AMBULATORY_CARE_PROVIDER_SITE_OTHER): Payer: BC Managed Care – PPO | Admitting: Internal Medicine

## 2013-05-13 ENCOUNTER — Encounter: Payer: Self-pay | Admitting: Internal Medicine

## 2013-05-13 VITALS — BP 150/110 | HR 56 | Temp 98.3°F | Wt 220.8 lb

## 2013-05-13 DIAGNOSIS — I10 Essential (primary) hypertension: Secondary | ICD-10-CM

## 2013-05-13 DIAGNOSIS — M109 Gout, unspecified: Secondary | ICD-10-CM

## 2013-05-13 NOTE — Patient Instructions (Addendum)
Next visit in 1-2 months  for a physical exam  Please make an appointment before you leave the office today (or call few weeks in advance) ---- Check the  blood pressure 2 or 3 times a week, be sure it is between 110/60 and 140/85. If it is consistently higher or lower, let me know     Sodium-Controlled Diet Sodium is a mineral. It is found in many foods. Sodium may be found naturally or added during the making of a food. The most common form of sodium is salt, which is made up of sodium and chloride. Reducing your sodium intake involves changing your eating habits. The following guidelines will help you reduce the sodium in your diet:  Stop using the salt shaker.  Use salt sparingly in cooking and baking.  Substitute with sodium-free seasonings and spices.  Do not use a salt substitute (potassium chloride) without your caregiver's permission.  Include a variety of fresh, unprocessed foods in your diet.  Limit the use of processed and convenience foods that are high in sodium. USE THE FOLLOWING FOODS SPARINGLY: Breads/Starches  Commercial bread stuffing, commercial pancake or waffle mixes, coating mixes. Waffles. Croutons. Prepared (boxed or frozen) potato, rice, or noodle mixes that contain salt or sodium. Salted [place] fries or hash browns. Salted popcorn, breads, crackers, chips, or snack foods. Vegetables  Vegetables canned with salt or prepared in cream, butter, or cheese sauces. Sauerkraut. Tomato or vegetable juices canned with salt.  Fresh vegetables are allowed if rinsed thoroughly. Fruit  Fruit is okay to eat. Meat and Meat Substitutes  Salted or smoked meats, such as bacon or Canadian bacon, chipped or corned beef, hot dogs, salt pork, luncheon meats, pastrami, ham, or sausage. Canned or smoked fish, poultry, or meat. Processed cheese or cheese spreads, blue or Roquefort cheese. Battered or frozen fish products. Prepared spaghetti sauce. Baked beans. Reuben sandwiches.  Salted nuts. Caviar. Milk  Limit buttermilk to 1 cup per week. Soups and Combination Foods  Bouillon cubes, canned or dried soups, broth, consomm. Convenience (frozen or packaged) dinners with more than 600 mg sodium. Pot pies, pizza, Asian food, fast food cheeseburgers, and specialty sandwiches. Desserts and Sweets  Regular (salted) desserts, pie, commercial fruit snack pies, commercial snack cakes, canned puddings.  Eat desserts and sweets in moderation. Fats and Oils  Gravy mixes or canned gravy. No more than 1 to 2 tbs of salad dressing. Chip dips.  Eat fats and oils in moderation. Beverages  See those listed under the vegetables and milk groups. Condiments  Ketchup, mustard, meat sauces, salsa, regular (salted) and lite soy sauce or mustard. Dill pickles, olives, meat tenderizer. Prepared horseradish or pickle relish. Dutch-processed cocoa. Baking powder or baking soda used medicinally. Worcestershire sauce. "Light" salt. Salt substitute, unless approved by your caregiver. Document Released: 03/07/2002 Document Revised: 12/08/2011 Document Reviewed: 10/08/2009 [facility] Patient Information 2014 [place], [place].

## 2013-05-13 NOTE — Assessment & Plan Note (Signed)
Doing great, has use meds one time in the last few months

## 2013-05-13 NOTE — Assessment & Plan Note (Addendum)
BP elevated today (150/110, recheck 165/85 ), good medication compliance, not taking Indocin regularly. Admits to some dietary indiscretion lately Plan: No change, self-monitoring see instructions, will salt discussed, increase exercise. Reassess on return to the office in few weeks

## 2013-05-13 NOTE — Progress Notes (Signed)
  Subjective:    Patient ID: Clifford Dean, male    DOB: 1956/07/27, 57 y.o.   MRN: [id number]  HPI Routine followup Hypertension, good medication compliance, ambulatory BPs range from 135, 145/85, 92. Gout, taking medications very seldom.  Past Medical History  Diagnosis Date  . Hypertension   . Allergic rhinitis   . Gout   . Prostate nodule      Bx (-) 2007   Past Surgical History  Procedure Laterality Date  . Prostate surgery  2007    prosate biopsy (-)   . Cardiolite  2003    negative    History   Social History  . Marital Status: Married    Spouse Name: N/A    Number of Children: 3  . Years of Education: N/A   Occupational History  . Scientist, research (life sciences)    .     Social History Main Topics  . Smoking status: Current Every Day Smoker  . Smokeless tobacco: Never Used     Comment: 1 pack a week  . Alcohol Use: 0.0 oz/week     Comment: wine at night   . Drug Use: Not on file  . Sexual Activity: Not on file   Other Topics Concern  . Not on file   Social History Narrative      Married, 3 children               Review of Systems Diet--usually try to watch carefully but in the last few days has been eating out frequently and had [place] yesterday. Exercise--he plays golf weekly and walks, also taking walks at the neighborhood. Denies chest pain, shortness of breath. No headaches.    Objective:   Physical Exam BP 150/110  Pulse 56  Temp(Src) 98.3 F (36.8 C)  Wt 220 lb 12.8 oz (100.154 kg)  BMI 31.68 kg/m2  SpO2 97%  General -- alert, well-developed, NAD.  Lungs -- normal respiratory effort, no intercostal retractions, no accessory muscle use, and normal breath sounds.  Heart-- normal rate, regular rhythm, no murmur.   Extremities-- trace pretibial edema bilaterally  Neurologic-- alert & oriented X3 Psych-- Cognition and judgment appear intact. Alert and cooperative with normal attention span and concentration. not anxious appearing and not  depressed appearing.         Assessment & Plan:

## 2013-05-14 ENCOUNTER — Encounter: Payer: Self-pay | Admitting: Internal Medicine

## 2013-05-16 ENCOUNTER — Other Ambulatory Visit: Payer: Self-pay | Admitting: Internal Medicine

## 2013-05-16 NOTE — Telephone Encounter (Signed)
Pt. Has has OV refill done per protocol.

## 2013-07-05 ENCOUNTER — Telehealth: Payer: Self-pay

## 2013-07-05 NOTE — Telephone Encounter (Signed)
LM for CB  HM reviewed. Due as noted: Flu vaccine CCS (?) 

## 2013-07-06 ENCOUNTER — Encounter: Payer: Self-pay | Admitting: Internal Medicine

## 2013-07-06 ENCOUNTER — Ambulatory Visit (INDEPENDENT_AMBULATORY_CARE_PROVIDER_SITE_OTHER): Payer: BC Managed Care – PPO | Admitting: Internal Medicine

## 2013-07-06 VITALS — BP 163/88 | HR 79 | Temp 99.0°F | Ht 70.1 in | Wt 216.6 lb

## 2013-07-06 DIAGNOSIS — Z23 Encounter for immunization: Secondary | ICD-10-CM

## 2013-07-06 DIAGNOSIS — Z0000 Encounter for general adult medical examination without abnormal findings: Secondary | ICD-10-CM

## 2013-07-06 DIAGNOSIS — R7309 Other abnormal glucose: Secondary | ICD-10-CM

## 2013-07-06 DIAGNOSIS — R739 Hyperglycemia, unspecified: Secondary | ICD-10-CM

## 2013-07-06 DIAGNOSIS — Z2911 Encounter for prophylactic immunotherapy for respiratory syncytial virus (RSV): Secondary | ICD-10-CM

## 2013-07-06 DIAGNOSIS — I10 Essential (primary) hypertension: Secondary | ICD-10-CM

## 2013-07-06 LAB — COMPREHENSIVE METABOLIC PANEL
AST: 22 U/L (ref 0–37)
Albumin: 4.3 g/dL (ref 3.5–5.2)
BUN: 14 mg/dL (ref 6–23)
CO2: 29 mEq/L (ref 19–32)
Calcium: 9.6 mg/dL (ref 8.4–10.5)
Chloride: 101 mEq/L (ref 96–112)
Creatinine, Ser: 1 mg/dL (ref 0.4–1.5)
GFR: 86.28 mL/min (ref >60.00)
Potassium: 4 mEq/L (ref 3.5–5.1)

## 2013-07-06 LAB — CBC WITH DIFFERENTIAL/PLATELET
Basophils Absolute: 0 10*3/uL (ref 0.0–0.1)
Eosinophils Absolute: 0.4 10*3/uL (ref 0.0–0.7)
Lymphocytes Relative: 37 % (ref 12.0–46.0)
MCHC: 33.9 g/dL (ref 30.0–36.0)
Monocytes Relative: 8.3 % (ref 3.0–12.0)
Neutrophils Relative %: 48.4 % (ref 43.0–77.0)
RBC: 5.15 Mil/uL (ref 4.22–5.81)
RDW: 13.3 % (ref 11.5–14.6)

## 2013-07-06 LAB — LIPID PANEL
Cholesterol: 237 mg/dL — ABNORMAL HIGH (ref 0–200)
HDL: 68.1 mg/dL (ref >39.00)
Triglycerides: 177 mg/dL — ABNORMAL HIGH (ref 0.0–149.0)

## 2013-07-06 MED ORDER — LOSARTAN POTASSIUM 50 MG PO TABS: 50.0000 mg | ORAL_TABLET | Freq: Every day | ORAL | Status: DC

## 2013-07-06 NOTE — Assessment & Plan Note (Addendum)
Td 2013 zostavax discussed -- will get today Declined a flu shot, benefits discussed  Tobacco-- counseled  Never got to do a cscope , difference between a colonoscopy and Hemoccults discuss, provided an iFOB but will call if interested in a colonoscopy labs PSA per urology

## 2013-07-06 NOTE — Assessment & Plan Note (Signed)
Ambulatory BPs 140, 150s. Needs better control Add losartan 50 mg. BMP today and in 2 weeks

## 2013-07-06 NOTE — Progress Notes (Signed)
  Subjective:    Patient ID: Clifford Dean, male    DOB: [birth year], 57 y.o.   MRN: [id number]  HPI CPX BP slt elevated, see a/p  Past Medical History  Diagnosis Date  . Hypertension   . Allergic rhinitis   . Gout   . Prostate nodule      Bx (-) 2007   Past Surgical History  Procedure Laterality Date  . Prostate surgery  2007    prosate biopsy (-)   . Cardiolite  2003    negative    History   Social History  . Marital Status: Married    Spouse Name: N/A    Number of Children: 3  . Years of Education: N/A   Occupational History  . Scientist, research (life sciences)    .     Social History Main Topics  . Smoking status: Current Every Day Smoker    Types: Cigarettes  . Smokeless tobacco: Never Used     Comment: 1-2 pack a week   . Alcohol Use: 0.0 oz/week     Comment: wine at night   . Drug Use: No  . Sexual Activity: Not on file   Other Topics Concern  . Not on file   Social History Narrative   Married, 3 children, 1 g-child born 2014           Family History  Problem Relation Age of Onset  . Colon cancer Neg Hx   . Prostate cancer Neg Hx   . Hypertension Mother   . Glaucoma Mother   . Heart attack Neg Hx   . Diabetes Neg Hx     Review of Systems Diet-- healthy, improving Exercise-- golf, taking a walk occasionally No  CP, SOB, lower extremity edema Denies  nausea, vomiting diarrhea Denies  blood in the stools (-) cough, sputum production No dysuria, gross hematuria, difficulty urinating   No anxiety, depression     Objective:   Physical Exam  BP 163/88  Pulse 79  Temp(Src) 99 F (37.2 C)  Ht 5' 10.1" (1.781 m)  Wt 216 lb 9.6 oz (98.249 kg)  BMI 30.97 kg/m2  SpO2 97% General -- alert, well-developed, NAD.  Neck --no thyromegaly , normal carotid pulse Lungs -- normal respiratory effort, no intercostal retractions, no accessory muscle use, and normal breath sounds.  Heart-- normal rate, regular rhythm, no murmur.  Abdomen-- Not distended, good  bowel sounds,soft, non-tender. Rectal-- No external abnormalities noted. Normal sphincter tone. No rectal masses or tenderness. Brown stool, Hemoccult negative  Prostate--Prostate gland firm and smooth, no enlargement, nodularity, tenderness, mass, asymmetry or induration. Extremities-- no pretibial edema bilaterally  Neurologic--  alert & oriented X3. Speech normal, gait normal, strength normal in all extremities.   Psych-- Cognition and judgment appear intact. Cooperative with normal attention span and concentration. No anxious appearing , no depressed appearing.      Assessment & Plan:

## 2013-07-06 NOTE — Telephone Encounter (Signed)
Unable to reach prior to visit  

## 2013-07-06 NOTE — Patient Instructions (Signed)
Get your blood work before you leave  Start taking losartan 50 mg one tablet daily, continue all other medications   We need to check your blood again in 2 or 3 weeks because the new medication (BMP, dx  Hypertension) ---->  please make an appointment  Check the  blood pressure 2 or 3 times a week, be sure it is between 110/60 and 140/85. If it is consistently higher or lower, let me know  Next visit in 4 months , Sooner if your blood pressure is not better

## 2013-07-22 ENCOUNTER — Other Ambulatory Visit (INDEPENDENT_AMBULATORY_CARE_PROVIDER_SITE_OTHER): Payer: BC Managed Care – PPO

## 2013-07-22 DIAGNOSIS — I10 Essential (primary) hypertension: Secondary | ICD-10-CM

## 2013-07-22 LAB — BASIC METABOLIC PANEL
BUN: 15 mg/dL (ref 6–23)
CO2: 29 mEq/L (ref 19–32)
Chloride: 103 mEq/L (ref 96–112)
Creatinine, Ser: 0.9 mg/dL (ref 0.4–1.5)
Glucose, Bld: 115 mg/dL — ABNORMAL HIGH (ref 70–99)

## 2013-07-22 NOTE — Addendum Note (Signed)
Addended by: Verdie Shire on: 07/22/2013 08:25 AM   Modules accepted: Orders

## 2013-08-04 ENCOUNTER — Other Ambulatory Visit: Payer: Self-pay

## 2013-08-10 ENCOUNTER — Other Ambulatory Visit: Payer: Self-pay | Admitting: Internal Medicine

## 2013-08-10 NOTE — Telephone Encounter (Signed)
Colcrys refilled.

## 2013-09-05 ENCOUNTER — Other Ambulatory Visit: Payer: Self-pay | Admitting: Internal Medicine

## 2013-09-05 NOTE — Telephone Encounter (Signed)
Losartan refilled per protocol 

## 2013-09-26 ENCOUNTER — Other Ambulatory Visit: Payer: Self-pay | Admitting: Internal Medicine

## 2013-09-26 NOTE — Telephone Encounter (Signed)
Colcrys refilled per protocol. JG//CMA

## 2013-11-14 ENCOUNTER — Other Ambulatory Visit: Payer: Self-pay | Admitting: Internal Medicine

## 2014-01-10 ENCOUNTER — Other Ambulatory Visit: Payer: Self-pay | Admitting: Internal Medicine

## 2014-03-03 ENCOUNTER — Other Ambulatory Visit: Payer: Self-pay | Admitting: Internal Medicine

## 2014-04-07 ENCOUNTER — Other Ambulatory Visit: Payer: Self-pay | Admitting: Internal Medicine

## 2014-06-04 ENCOUNTER — Other Ambulatory Visit: Payer: Self-pay | Admitting: Internal Medicine

## 2014-07-03 ENCOUNTER — Other Ambulatory Visit: Payer: Self-pay

## 2014-07-03 MED ORDER — METOPROLOL TARTRATE 100 MG PO TABS: ORAL_TABLET | ORAL | Status: DC

## 2014-07-18 ENCOUNTER — Other Ambulatory Visit: Payer: Self-pay

## 2014-07-18 MED ORDER — FELODIPINE ER 10 MG PO TB24: ORAL_TABLET | ORAL | Status: DC

## 2014-07-27 ENCOUNTER — Other Ambulatory Visit: Payer: Self-pay

## 2014-08-02 ENCOUNTER — Ambulatory Visit (INDEPENDENT_AMBULATORY_CARE_PROVIDER_SITE_OTHER): Payer: BC Managed Care – PPO | Admitting: Internal Medicine

## 2014-08-02 ENCOUNTER — Encounter: Payer: Self-pay | Admitting: Internal Medicine

## 2014-08-02 VITALS — BP 143/81 | HR 56 | Temp 98.3°F | Ht 69.0 in | Wt 219.4 lb

## 2014-08-02 DIAGNOSIS — I10 Essential (primary) hypertension: Secondary | ICD-10-CM

## 2014-08-02 DIAGNOSIS — E785 Hyperlipidemia, unspecified: Secondary | ICD-10-CM

## 2014-08-02 DIAGNOSIS — Z0000 Encounter for general adult medical examination without abnormal findings: Secondary | ICD-10-CM

## 2014-08-02 MED ORDER — LOSARTAN POTASSIUM 100 MG PO TABS: 100.0000 mg | ORAL_TABLET | Freq: Every day | ORAL | Status: DC

## 2014-08-02 MED ORDER — FELODIPINE ER 10 MG PO TB24: 10.0000 mg | ORAL_TABLET | Freq: Every day | ORAL | Status: DC

## 2014-08-02 MED ORDER — INDOMETHACIN 50 MG PO CAPS: 50.0000 mg | ORAL_CAPSULE | Freq: Three times a day (TID) | ORAL | Status: DC | PRN

## 2014-08-02 MED ORDER — METOPROLOL TARTRATE 100 MG PO TABS: 100.0000 mg | ORAL_TABLET | Freq: Two times a day (BID) | ORAL | Status: DC

## 2014-08-02 MED ORDER — COLCHICINE 0.6 MG PO TABS: ORAL_TABLET | ORAL | Status: DC

## 2014-08-02 NOTE — Assessment & Plan Note (Signed)
Td 2013 Had a zostavax   Declined a flu shot  Tobacco-- counseled  Never got to do a cscope , or an iFOB , rec a cscope, encouraged to discuss w/ new PCP  Labs including a PSA

## 2014-08-02 NOTE — Progress Notes (Signed)
Pre visit review using our clinic review tool, if applicable. No additional management support is needed unless otherwise documented below in the visit note. 

## 2014-08-02 NOTE — Assessment & Plan Note (Addendum)
Good compliance with losartan, metoprolol. Ambulatory blood pressure 140/80 but sometimes 145/90 Plan: Continue with  metoprolol. Increase losartan from 50 mg to 100 mg. Advise patient to continue monitoring his BPs and get established with a new doctor within 2-3 months, he needs a BP check and a BMP. He reports understanding

## 2014-08-02 NOTE — Progress Notes (Signed)
Subjective:    Patient ID: Clifford Dean, male    DOB: [birth year], 58 y.o.   MRN: [id number]  DOS:  08/02/2014 Type of visit - description : cpx Interval history: In general feels very well, he already move to another city and this will be his last visit to this office   ROS  denies chest pain or difficulty breathing No nausea, vomiting, diarrhea No dysuria, gross hematuria or difficulty urinating  Past Medical History  Diagnosis Date  . Hypertension   . Allergic rhinitis   . Gout   . Prostate nodule      Bx (-) 2007    Past Surgical History  Procedure Laterality Date  . Prostate surgery  2007    prosate biopsy (-)   . Cardiolite  2003    negative     History   Social History  . Marital Status: Married    Spouse Name: N/A    Number of Children: 3  . Years of Education: N/A   Occupational History  . 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     Social History Main Topics  . Smoking status: Current Every Day Smoker    Types: Cigarettes  . Smokeless tobacco: Never Used     Comment: 1 pack a week   . Alcohol Use: 0.0 oz/week    0 Not specified per week     Comment: wine at night   . Drug Use: No  . Sexual Activity: Not on file   Other Topics Concern  . Not on file   Social History Narrative   Married, 3 children, 1 g-child born 2014    [place]     Family History  Problem Relation Age of Onset  . Colon cancer Neg Hx   . Prostate cancer Neg Hx   . Hypertension Mother   . Glaucoma Mother   . Heart attack Neg Hx   . Diabetes Neg Hx   . Stroke Neg Hx   . Autoimmune disease Mother        Medication List       This list is accurate as of: 08/02/14 11:59 PM.  Always use your most recent med list.               aspirin 81 MG tablet  Take 81 mg by mouth daily.     colchicine 0.6 MG tablet  Commonly known as:  COLCRYS  TAKE 1 TABLET BY MOUTH 3 TIMES DAILY AS NEEDED FOR GOUT     felodipine 10 MG 24 hr tablet  Commonly known as:  PLENDIL    Take 1 tablet (10 mg total) by mouth daily.     indomethacin 50 MG capsule  Commonly known as:  INDOCIN  Take 1 capsule (50 mg total) by mouth 3 (three) times daily as needed (gout ).     losartan 100 MG tablet  Commonly known as:  COZAAR  Take 1 tablet (100 mg total) by mouth daily.     metoprolol 100 MG tablet  Commonly known as:  LOPRESSOR  Take 1 tablet (100 mg total) by mouth 2 (two) times daily.           Objective:   Physical Exam BP 143/81 mmHg  Pulse 56  Temp(Src) 98.3 F (36.8 C) (Oral)  Ht 5\' 9"  (1.753 m)  Wt 219 lb 6 oz (99.508 kg)  BMI 32.38 kg/m2  SpO2 96% General -- alert, well-developed, NAD.  Neck --no thyromegaly  HEENT-- Not pale.   Lungs -- normal respiratory effort, no intercostal retractions, no accessory muscle use, and normal breath sounds.  Heart-- normal rate, regular rhythm, no murmur.  Abdomen-- Not distended, good bowel sounds,soft, non-tender. No rebound or rigidity.   Rectal-- No external abnormalities noted. Normal sphincter tone. No rectal masses or tenderness. Stool brown   Prostate--Prostate gland firm and smooth, no enlargement, nodularity, tenderness, mass, asymmetry or induration. Extremities-- no pretibial edema bilaterally  Neurologic--  alert & oriented X3. Speech normal, gait appropriate for age, strength symmetric and appropriate for age.  Psych-- Cognition and judgment appear intact. Cooperative with normal attention span and concentration. No anxious or depressed appearing.      Assessment & Plan:

## 2014-08-02 NOTE — Patient Instructions (Addendum)
Get your blood work before you leave   Increase losartan from 50 mg daily to 100 mg daily  Check the  blood pressure 2 or 3 times a   Week   Be sure your blood pressure is between  140/85  and 110/65.  if it is consistently higher or lower, let me know    Please see your new doctor within 2 months as we have change your losartan dose. You will need a potassium and kidney function tests (BMP). If you're not able to get a new doctor, please call and make an appointment with us.  Talk with your newdoctor about getting a colonoscopy

## 2014-08-03 ENCOUNTER — Telehealth: Payer: Self-pay | Admitting: Internal Medicine

## 2014-08-03 LAB — CBC WITH DIFFERENTIAL/PLATELET
BASOS ABS: 0.1 10*3/uL (ref 0.0–0.1)
Basophils Relative: 1.3 % (ref 0.0–3.0)
EOS ABS: 0.5 10*3/uL (ref 0.0–0.7)
Eosinophils Relative: 5.8 % — ABNORMAL HIGH (ref 0.0–5.0)
HCT: 45.4 % (ref 39.0–52.0)
Hemoglobin: 15.3 g/dL (ref 13.0–17.0)
Lymphocytes Relative: 37.3 % (ref 12.0–46.0)
Lymphs Abs: 3.1 10*3/uL (ref 0.7–4.0)
MCHC: 33.8 g/dL (ref 30.0–36.0)
MCV: 91.2 fl (ref 78.0–100.0)
Monocytes Absolute: 0.5 10*3/uL (ref 0.1–1.0)
Monocytes Relative: 6.2 % (ref 3.0–12.0)
Neutro Abs: 4.1 10*3/uL (ref 1.4–7.7)
Neutrophils Relative %: 49.4 % (ref 43.0–77.0)
Platelets: 245 10*3/uL (ref 150.0–400.0)
RBC: 4.98 Mil/uL (ref 4.22–5.81)
RDW: 13.6 % (ref 11.5–15.5)
WBC: 8.4 10*3/uL (ref 4.0–10.5)

## 2014-08-03 LAB — COMPREHENSIVE METABOLIC PANEL
ALBUMIN: 3.7 g/dL (ref 3.5–5.2)
ALT: 29 U/L (ref 0–53)
AST: 23 U/L (ref 0–37)
Alkaline Phosphatase: 57 U/L (ref 39–117)
BUN: 12 mg/dL (ref 6–23)
CALCIUM: 9.9 mg/dL (ref 8.4–10.5)
CHLORIDE: 101 meq/L (ref 96–112)
CO2: 25 mEq/L (ref 19–32)
Creatinine, Ser: 1 mg/dL (ref 0.4–1.5)
GFR: 83.92 mL/min (ref >60.00)
Glucose, Bld: 90 mg/dL (ref 70–99)
POTASSIUM: 4.1 meq/L (ref 3.5–5.1)
Sodium: 137 mEq/L (ref 135–145)
Total Bilirubin: 0.6 mg/dL (ref 0.2–1.2)
Total Protein: 6.8 g/dL (ref 6.0–8.3)

## 2014-08-03 LAB — LIPID PANEL
CHOLESTEROL: 204 mg/dL — AB (ref 0–200)
HDL: 49.1 mg/dL (ref >39.00)
NONHDL: 154.9
TRIGLYCERIDES: 320 mg/dL — AB (ref 0.0–149.0)
Total CHOL/HDL Ratio: 4
VLDL: 64 mg/dL — ABNORMAL HIGH (ref 0.0–40.0)

## 2014-08-03 LAB — TSH: TSH: 1.53 u[IU]/mL (ref 0.35–4.50)

## 2014-08-03 LAB — LDL CHOLESTEROL, DIRECT: Direct LDL: 103.3 mg/dL

## 2014-08-03 LAB — HEMOGLOBIN A1C: Hgb A1c MFr Bld: 5.5 % (ref 4.6–6.5)

## 2014-08-03 LAB — PSA: PSA: 1 ng/mL (ref 0.10–4.00)

## 2014-08-03 NOTE — Telephone Encounter (Signed)
emmi emailed °

## 2014-08-14 ENCOUNTER — Other Ambulatory Visit: Payer: Self-pay | Admitting: Internal Medicine

## 2015-07-21 ENCOUNTER — Other Ambulatory Visit: Payer: Self-pay | Admitting: Internal Medicine

## 2015-07-23 ENCOUNTER — Other Ambulatory Visit: Payer: Self-pay | Admitting: Internal Medicine

## 2015-07-23 ENCOUNTER — Telehealth: Payer: Self-pay | Admitting: Internal Medicine

## 2015-07-23 NOTE — Telephone Encounter (Signed)
LMOM informing Pt to return call. Per OV notes on 08/02/2014, Losartan was changed from 50 mg to 100 mg, Dr. Drue NovelPaz instructed Pt to find new PCP within 2 months as kidney tests needed to be drawn, informed him if he did not find a new PCP within that time he would need to call office and make an appointment to see us. Unable to refill medication until Pt has blood drawn.

## 2015-07-23 NOTE — Telephone Encounter (Signed)
Pt states that he is out of metoprolol and losartan. He does not have any for tomorrow of either med. He said that he doesn't have a new provider yet. Pt said if he could get a 30 day supply while he tries to find a new doctor he would appreciate it. Pt phone # [phone].

## 2015-07-23 NOTE — Telephone Encounter (Signed)
Noted  

## 2015-07-23 NOTE — Telephone Encounter (Signed)
[id number] - [place], [place] - [address]

## 2015-07-23 NOTE — Telephone Encounter (Signed)
Patient Instructions     Get your blood work before you leave   Increase losartan from 50 mg daily to 100 mg daily  Check the blood pressure 2 or 3 times a Week  Be sure your blood pressure is between 140/85 and 110/65. if it is consistently higher or lower, let me know   Please see your new doctor within 2 months as we have change your losartan dose. You will need a potassium and kidney function tests (BMP). If you're not able to get a new doctor, please call and make an appointment with us.  Talk with your newdoctor about getting a colonoscopy

## 2015-07-23 NOTE — Telephone Encounter (Signed)
Pt returned your call, informed him of the message below. Pt expressed understanding.

## 2015-07-23 NOTE — Telephone Encounter (Signed)
Pt is returning your call

## 2015-07-24 ENCOUNTER — Other Ambulatory Visit: Payer: Self-pay | Admitting: Internal Medicine

## 2015-07-26 ENCOUNTER — Other Ambulatory Visit: Payer: Self-pay | Admitting: Internal Medicine

## 2015-07-26 ENCOUNTER — Other Ambulatory Visit: Payer: Self-pay

## 2015-07-26 MED ORDER — METOPROLOL TARTRATE 100 MG PO TABS: 100.0000 mg | ORAL_TABLET | Freq: Two times a day (BID) | ORAL | Status: DC

## 2015-07-26 NOTE — Telephone Encounter (Signed)
Spoke with Pt, he informed me that he still has several days left of Losartan but only 1 tablet of Metoprolol left. Informed him that I could send in only a 7 day supply and make him an appt with Dr. Drue NovelPaz to check blood pressure and kidney and liver functions. Pt scheduled acute visit on 07/30/2015 at 0930. Metoprolol #14 tablets [facility] [place]. Pt thanked me for my help.

## 2015-07-26 NOTE — Telephone Encounter (Signed)
Pt is requesting a call back from CMA directly because he says that he is almost out of his medications.  (Losartan,Metoprolol)  Pt says that he went back to pharmacy and they do not show any refills at all.      CB: [phone]

## 2015-07-30 ENCOUNTER — Ambulatory Visit (INDEPENDENT_AMBULATORY_CARE_PROVIDER_SITE_OTHER): Payer: BLUE CROSS/BLUE SHIELD | Admitting: Internal Medicine

## 2015-07-30 ENCOUNTER — Telehealth: Payer: Self-pay | Admitting: Emergency Medicine

## 2015-07-30 ENCOUNTER — Encounter: Payer: Self-pay | Admitting: Internal Medicine

## 2015-07-30 VITALS — BP 116/78 | HR 56 | Temp 98.3°F | Ht 69.0 in | Wt 216.2 lb

## 2015-07-30 DIAGNOSIS — I10 Essential (primary) hypertension: Secondary | ICD-10-CM | POA: Diagnosis not present

## 2015-07-30 DIAGNOSIS — E785 Hyperlipidemia, unspecified: Secondary | ICD-10-CM

## 2015-07-30 DIAGNOSIS — M109 Gout, unspecified: Secondary | ICD-10-CM | POA: Diagnosis not present

## 2015-07-30 DIAGNOSIS — Z09 Encounter for follow-up examination after completed treatment for conditions other than malignant neoplasm: Secondary | ICD-10-CM

## 2015-07-30 DIAGNOSIS — Z1159 Encounter for screening for other viral diseases: Secondary | ICD-10-CM

## 2015-07-30 DIAGNOSIS — Z114 Encounter for screening for human immunodeficiency virus [HIV]: Secondary | ICD-10-CM

## 2015-07-30 LAB — BASIC METABOLIC PANEL
BUN: 13 mg/dL (ref 6–23)
CHLORIDE: 102 meq/L (ref 96–112)
CO2: 25 meq/L (ref 19–32)
CREATININE: 0.81 mg/dL (ref 0.40–1.50)
Calcium: 10.1 mg/dL (ref 8.4–10.5)
GFR: 102.99 mL/min (ref >60.00)
Glucose, Bld: 112 mg/dL — ABNORMAL HIGH (ref 70–99)
POTASSIUM: 4 meq/L (ref 3.5–5.1)
Sodium: 137 mEq/L (ref 135–145)

## 2015-07-30 LAB — LIPID PANEL
CHOL/HDL RATIO: 3
CHOLESTEROL: 197 mg/dL (ref 0–200)
HDL: 62 mg/dL (ref >39.00)
NONHDL: 134.93
TRIGLYCERIDES: 203 mg/dL — AB (ref 0.0–149.0)
VLDL: 40.6 mg/dL — AB (ref 0.0–40.0)

## 2015-07-30 LAB — LDL CHOLESTEROL, DIRECT: LDL DIRECT: 108 mg/dL

## 2015-07-30 LAB — URIC ACID: Uric Acid, Serum: 8.8 mg/dL — ABNORMAL HIGH (ref 4.0–7.8)

## 2015-07-30 MED ORDER — FELODIPINE ER 10 MG PO TB24: 10.0000 mg | ORAL_TABLET | Freq: Every day | ORAL | Status: DC

## 2015-07-30 MED ORDER — METOPROLOL TARTRATE 100 MG PO TABS: 100.0000 mg | ORAL_TABLET | Freq: Two times a day (BID) | ORAL | Status: DC

## 2015-07-30 MED ORDER — LOSARTAN POTASSIUM 100 MG PO TABS: 100.0000 mg | ORAL_TABLET | Freq: Every day | ORAL | Status: DC

## 2015-07-30 MED ORDER — COLCHICINE 0.6 MG PO TABS: 0.6000 mg | ORAL_TABLET | Freq: Three times a day (TID) | ORAL | Status: DC | PRN

## 2015-07-30 NOTE — Telephone Encounter (Signed)
Spoke with patient and he will call back to schedule lab appointment only, orders are in as future...KMP

## 2015-07-30 NOTE — Progress Notes (Signed)
Subjective:    Patient ID: Clifford Dean, male    DOB: [birth year], 59 y.o.   MRN: [id number]  DOS:  07/30/2015 Type of visit - description : routine visit Interval history: HTN: Good compliance of medication, ambulatory BPs in the 130s/80 range. No apparent side effects Gout: Essentially asymptomatic, yesterday he had a very mild left foot pain, took colchicine and he is now asymptomatic Dislipidemia: Increased triglycerides, on no medication   Review of Systems denies chest pain or difficulty breathing No nausea, vomiting, diarrhea  Past Medical History  Diagnosis Date  . Hypertension   . Allergic rhinitis   . Gout   . Prostate nodule      Bx (-) 2007    Past Surgical History  Procedure Laterality Date  . Prostate surgery  2007    prosate biopsy (-)   . Cardiolite  2003    negative     Social History   Social History  . Marital Status: Married    Spouse Name: N/A  . Number of Children: 3  . Years of Education: N/A   Occupational History  . 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     Social History Main Topics  . Smoking status: Current Every Day Smoker    Types: Cigarettes  . Smokeless tobacco: Never Used     Comment: 1 pack a week   . Alcohol Use: 0.0 oz/week    0 Standard drinks or equivalent per week     Comment: wine at night   . Drug Use: No  . Sexual Activity: Not on file   Other Topics Concern  . Not on file   Social History Narrative   Married, 3 children, 1 g-child born 2014    [place]        Medication List       This list is accurate as of: 07/30/15  4:53 PM.  Always use your most recent med list.               aspirin 81 MG tablet  Take 81 mg by mouth daily.     colchicine 0.6 MG tablet  Commonly known as:  COLCRYS  Take 1 tablet (0.6 mg total) by mouth 3 (three) times daily as needed.     felodipine 10 MG 24 hr tablet  Commonly known as:  PLENDIL  Take 1 tablet (10 mg total) by mouth daily.     indomethacin 50 MG  capsule  Commonly known as:  INDOCIN  Take 1 capsule (50 mg total) by mouth 3 (three) times daily as needed (gout ).     losartan 100 MG tablet  Commonly known as:  COZAAR  Take 1 tablet (100 mg total) by mouth daily.     metoprolol 100 MG tablet  Commonly known as:  LOPRESSOR  Take 1 tablet (100 mg total) by mouth 2 (two) times daily.           Objective:   Physical Exam BP 116/78 mmHg  Pulse 56  Temp(Src) 98.3 F (36.8 C) (Oral)  Ht 5\' 9"  (1.753 m)  Wt 216 lb 4 oz (98.09 kg)  BMI 31.92 kg/m2  SpO2 96% General:   Well developed, well nourished . NAD.  HEENT:  Normocephalic . Face symmetric, atraumatic Lungs:  CTA B Normal respiratory effort, no intercostal retractions, no accessory muscle use. Heart: RRR,  no murmur.  No pretibial edema bilaterally  Skin: Not pale. Not jaundice Neurologic:  alert & oriented X3.  Speech normal, gait appropriate for age and unassisted Psych--  Cognition and judgment appear intact.  Cooperative with normal attention span and concentration.  Behavior appropriate. No anxious or depressed appearing.      Assessment & Plan:   Assessment > HTN Dyslipidemia: Mild Gout Prostate nodule bx  (-) 2007 Allergic rhinitis  Plan: HTN: Seems to be well-controlled, refill meds, check a BMP Mild dyslipidemia: Increased triglycerides, check labs Gout: had a  ill-defined left foot pain yesterday, took one colchicine , now asymptomatic. Check a uric acid Primary care: Flu shot declined, he now lives in [facility], plans to get M.D over there however he is welcome to continue coming here every few months for routine care .

## 2015-07-30 NOTE — Telephone Encounter (Addendum)
Called patient but didn't leave message, need to get SST for labs drawn on 07/30/2015.Marland Kitchen.Marland Kitchen.KMP

## 2015-07-30 NOTE — Assessment & Plan Note (Signed)
HTN: Seems to be well-controlled, refill meds, check a BMP Mild dyslipidemia: Increased triglycerides, check labs Gout: had a  ill-defined left foot pain yesterday, took one colchicine , now asymptomatic. Check a uric acid Primary care: Flu shot declined, [place], plans to get M.D over there however he is welcome to continue coming here every few months for routine care .

## 2015-07-30 NOTE — Progress Notes (Signed)
Pre visit review using our clinic review tool, if applicable. No additional management support is needed unless otherwise documented below in the visit note. 

## 2015-07-30 NOTE — Patient Instructions (Addendum)
Get your blood work before you leave      Next visit  for a  Physical exam in 6 months Please schedule an appointment at the front desk Please come back fasting  

## 2016-01-05 ENCOUNTER — Other Ambulatory Visit: Payer: Self-pay | Admitting: Internal Medicine

## 2016-04-07 ENCOUNTER — Other Ambulatory Visit: Payer: Self-pay | Admitting: Internal Medicine

## 2016-04-09 ENCOUNTER — Other Ambulatory Visit: Payer: Self-pay | Admitting: Internal Medicine

## 2016-04-22 ENCOUNTER — Encounter: Payer: Self-pay | Admitting: Medical

## 2016-04-22 ENCOUNTER — Ambulatory Visit (INDEPENDENT_AMBULATORY_CARE_PROVIDER_SITE_OTHER): Payer: BLUE CROSS/BLUE SHIELD | Admitting: Medical

## 2016-04-22 ENCOUNTER — Ambulatory Visit (HOSPITAL_BASED_OUTPATIENT_CLINIC_OR_DEPARTMENT_OTHER)
Admission: RE | Admit: 2016-04-22 | Discharge: 2016-04-22 | Disposition: A | Discharge status: Home / Self Care | Payer: BLUE CROSS/BLUE SHIELD | Source: Ambulatory Visit | Attending: Medical | Admitting: Medical

## 2016-04-22 ENCOUNTER — Other Ambulatory Visit: Payer: Self-pay | Admitting: Medical

## 2016-04-22 VITALS — BP 130/82 | HR 51 | Temp 97.9°F | Ht 69.0 in | Wt 216.0 lb

## 2016-04-22 DIAGNOSIS — M25532 Pain in left wrist: Secondary | ICD-10-CM

## 2016-04-22 DIAGNOSIS — M109 Gout, unspecified: Secondary | ICD-10-CM

## 2016-04-22 DIAGNOSIS — E785 Hyperlipidemia, unspecified: Secondary | ICD-10-CM | POA: Diagnosis not present

## 2016-04-22 DIAGNOSIS — I10 Essential (primary) hypertension: Secondary | ICD-10-CM | POA: Diagnosis not present

## 2016-04-22 LAB — COMPREHENSIVE METABOLIC PANEL
ALT: 28 U/L (ref 0–53)
AST: 19 U/L (ref 0–37)
Albumin: 4.1 g/dL (ref 3.5–5.2)
Alkaline Phosphatase: 66 U/L (ref 39–117)
BILIRUBIN TOTAL: 0.8 mg/dL (ref 0.2–1.2)
BUN: 15 mg/dL (ref 6–23)
CHLORIDE: 103 meq/L (ref 96–112)
CO2: 26 meq/L (ref 19–32)
Calcium: 9.8 mg/dL (ref 8.4–10.5)
Creatinine, Ser: 0.85 mg/dL (ref 0.40–1.50)
GFR: 97.18 mL/min (ref >60.00)
GLUCOSE: 118 mg/dL — AB (ref 70–99)
Potassium: 4.3 mEq/L (ref 3.5–5.1)
Sodium: 136 mEq/L (ref 135–145)
Total Protein: 7.1 g/dL (ref 6.0–8.3)

## 2016-04-22 LAB — CBC WITH DIFFERENTIAL/PLATELET
BASOS ABS: 0 10*3/uL (ref 0.0–0.1)
Basophils Relative: 0.4 % (ref 0.0–3.0)
EOS ABS: 0.5 10*3/uL (ref 0.0–0.7)
Eosinophils Relative: 5.2 % — ABNORMAL HIGH (ref 0.0–5.0)
HCT: 44.6 % (ref 39.0–52.0)
Hemoglobin: 15.1 g/dL (ref 13.0–17.0)
LYMPHS ABS: 2.7 10*3/uL (ref 0.7–4.0)
Lymphocytes Relative: 27.4 % (ref 12.0–46.0)
MCHC: 33.9 g/dL (ref 30.0–36.0)
MCV: 89.7 fl (ref 78.0–100.0)
Monocytes Absolute: 0.7 10*3/uL (ref 0.1–1.0)
Monocytes Relative: 7.2 % (ref 3.0–12.0)
NEUTROS ABS: 5.8 10*3/uL (ref 1.4–7.7)
NEUTROS PCT: 59.8 % (ref 43.0–77.0)
PLATELETS: 224 10*3/uL (ref 150.0–400.0)
RBC: 4.97 Mil/uL (ref 4.22–5.81)
RDW: 13.8 % (ref 11.5–15.5)
WBC: 9.7 10*3/uL (ref 4.0–10.5)

## 2016-04-22 LAB — LIPID PANEL
CHOL/HDL RATIO: 4
Cholesterol: 190 mg/dL (ref 0–200)
HDL: 50.7 mg/dL (ref >39.00)
NONHDL: 139.05
Triglycerides: 206 mg/dL — ABNORMAL HIGH (ref 0.0–149.0)
VLDL: 41.2 mg/dL — ABNORMAL HIGH (ref 0.0–40.0)

## 2016-04-22 LAB — URIC ACID: URIC ACID, SERUM: 8.2 mg/dL — AB (ref 4.0–7.8)

## 2016-04-22 LAB — LDL CHOLESTEROL, DIRECT: Direct LDL: 113 mg/dL

## 2016-04-22 MED ORDER — LOSARTAN POTASSIUM 100 MG PO TABS: 100.0000 mg | ORAL_TABLET | Freq: Every day | ORAL | 1 refills | Status: DC

## 2016-04-22 MED ORDER — METOPROLOL TARTRATE 100 MG PO TABS: 100.0000 mg | ORAL_TABLET | Freq: Two times a day (BID) | ORAL | 1 refills | Status: DC

## 2016-04-22 MED ORDER — INDOMETHACIN 50 MG PO CAPS: 50.0000 mg | ORAL_CAPSULE | Freq: Three times a day (TID) | ORAL | 1 refills | Status: DC | PRN

## 2016-04-22 MED ORDER — FELODIPINE ER 10 MG PO TB24: 10.0000 mg | ORAL_TABLET | Freq: Every day | ORAL | 1 refills | Status: DC

## 2016-04-22 MED ORDER — COLCHICINE 0.6 MG PO TABS: 0.6000 mg | ORAL_TABLET | Freq: Three times a day (TID) | ORAL | 1 refills | Status: DC | PRN

## 2016-04-22 NOTE — Patient Instructions (Signed)
Your bp is good today. I will continue your current regimen and give you 90 days with one refill.   For you hx of high cholesterol will recheck today.   For gout will refill you colchicine and will rx limited 5 days rx of indocin.  Will get cbc,cmp, uric acid and lipid panel today.  Will put xray of left wrist today.  Follow up with potential new pcp in [facility] or with Dr Paz(prn here as well)

## 2016-04-22 NOTE — Progress Notes (Signed)
Subjective:    Patient ID: Clifford Dean, male    DOB: Aug 15, 1956, 60 y.o.   MRN: [id number]  HPI  Pt in for follow up. Pt states 2 yrs ago moved to [place]. He got new dentist but not new MD.  Pt states his wife found a MD. Pt states will run out bp med in next 2 days.   Pt htn has been controlled. He states most of time bp around 130/80. No cardiac or neurologic sign or symptoms.  Pt states he get occasional flair of gout. He states minimal faint slight wrist pain left side. Only noticed slightly yesterday. He took one colchicine yesterday. Sometimes just has to use for 2 days and flare resolves.     Review of Systems  Constitutional: Negative for chills, fatigue and fever.  HENT: Negative for congestion.   Respiratory: Negative for cough, chest tightness, shortness of breath and wheezing.   Cardiovascular: Negative for chest pain and palpitations.  Gastrointestinal: Negative for abdominal pain.  Musculoskeletal:       Faint left wrist pain.  Neurological: Negative for dizziness, syncope, weakness, numbness and headaches.  Hematological: Negative for adenopathy. Does not bruise/bleed easily.  Psychiatric/Behavioral: Negative for behavioral problems, confusion, decreased concentration and dysphoric mood. The patient is not nervous/anxious.     Past Medical History:  Diagnosis Date  . Allergic rhinitis   . Gout   . Hypertension   . Prostate nodule     Bx (-) 2007     Social History   Social History  . Marital status: Married    Spouse name: N/A  . Number of children: 3  . Years of education: N/A   Occupational History  . Cytogeneticist   .  [employer]   Social History Main Topics  . Smoking status: Current Every Day Smoker    Types: Cigarettes  . Smokeless tobacco: Never Used     Comment: 1 pack a week   . Alcohol use 0.0 oz/week     Comment: wine at night   . Drug use: No  . Sexual activity: Not on file   Other Topics Concern  . Not on file     Social History Narrative   Married, 3 children, 1 g-child born 2014    moved to [place] ~ 6 -2015    Past Surgical History:  Procedure Laterality Date  . cardiolite  2003   negative   . PROSTATE SURGERY  2007   prosate biopsy (-)     Family History  Problem Relation Age of Onset  . Colon cancer Neg Hx   . Prostate cancer Neg Hx   . Hypertension Mother   . Glaucoma Mother   . Heart attack Neg Hx   . Diabetes Neg Hx   . Stroke Neg Hx   . Autoimmune disease Mother     No Known Allergies  Current Outpatient Prescriptions on File Prior to Visit  Medication Sig Dispense Refill  . aspirin 81 MG tablet Take 81 mg by mouth daily.      . colchicine (COLCRYS) 0.6 MG tablet Take 1 tablet (0.6 mg total) by mouth 3 (three) times daily as needed. 90 tablet 6  . felodipine (PLENDIL) 10 MG 24 hr tablet Take 1 tablet (10 mg total) by mouth daily. 30 tablet 0  . losartan (COZAAR) 100 MG tablet Take 1 tablet (100 mg total) by mouth daily. 90 tablet 0  . metoprolol (LOPRESSOR) 100 MG tablet  Take 1 tablet (100 mg total) by mouth 2 (two) times daily. 180 tablet 0   No current facility-administered medications on file prior to visit.     BP 130/82 (BP Location: Left Arm, Patient Position: Sitting, Cuff Size: Normal)   Pulse (!) 51   Temp 97.9 F (36.6 C) (Oral)   Ht 5\' 9"  (1.753 m)   Wt 216 lb (98 kg)   SpO2 97%   BMI 31.90 kg/m       Objective:   Physical Exam  General Mental Status- Alert. General Appearance- Not in acute distress.   Skin General: Color- Normal Color. Moisture- Normal Moisture.  Neck Carotid Arteries- Normal color. Moisture- Normal Moisture. No carotid bruits. No JVD.  Chest and Lung Exam Auscultation: Breath Sounds:-Normal.  Cardiovascular Auscultation:Rythm- Regular. Murmurs & Other Heart Sounds:Auscultation of the heart reveals- No Murmurs.   Neurologic Cranial Nerve exam:- CN III-XII intact(No nystagmus), symmetric smile. Strength:- 5/5  equal and symmetric strength both upper and lower extremities.  Lt wrist- good rom. No pain. Faint tenderness of wrist. Lateral aspect ulnar styloid region.      Assessment & Plan:  Your bp is good today. I will continue your current regimen and give you 90 days with one refill.   For you hx of high cholesterol will recheck today.   For gout will refill you colchicine and will rx limited 5 days rx of indocin.  Will get cbc,cmp, uric acid and lipid panel today.  Will put xray of left wrist today.  Follow up with potential new pcp in [facility] or with Dr Paz(prn here as well)  Daesean Lazarz, Ramon Dredge, PA-C

## 2016-04-22 NOTE — Progress Notes (Signed)
Pre visit review using our clinic review tool, if applicable. No additional management support is needed unless otherwise documented below in the visit note. 

## 2016-04-28 NOTE — Progress Notes (Signed)
Pt has seen results on MyChart and message also sent for patient to call back if any questions.

## 2016-10-04 ENCOUNTER — Other Ambulatory Visit: Payer: Self-pay | Admitting: Medical

## 2016-11-07 ENCOUNTER — Other Ambulatory Visit: Payer: Self-pay | Admitting: Medical

## 2016-11-25 ENCOUNTER — Other Ambulatory Visit: Payer: Self-pay | Admitting: Internal Medicine

## 2016-12-07 ENCOUNTER — Other Ambulatory Visit: Payer: Self-pay | Admitting: Internal Medicine

## 2016-12-21 ENCOUNTER — Other Ambulatory Visit: Payer: Self-pay | Admitting: Internal Medicine

## 2016-12-25 ENCOUNTER — Other Ambulatory Visit: Payer: Self-pay | Admitting: Internal Medicine

## 2016-12-30 ENCOUNTER — Ambulatory Visit (INDEPENDENT_AMBULATORY_CARE_PROVIDER_SITE_OTHER): Payer: BLUE CROSS/BLUE SHIELD | Admitting: Internal Medicine

## 2016-12-30 ENCOUNTER — Encounter: Payer: Self-pay | Admitting: Internal Medicine

## 2016-12-30 VITALS — BP 132/78 | HR 85 | Temp 98.0°F | Resp 14 | Ht 69.0 in | Wt 220.4 lb

## 2016-12-30 DIAGNOSIS — M109 Gout, unspecified: Secondary | ICD-10-CM

## 2016-12-30 DIAGNOSIS — Z114 Encounter for screening for human immunodeficiency virus [HIV]: Secondary | ICD-10-CM

## 2016-12-30 DIAGNOSIS — R739 Hyperglycemia, unspecified: Secondary | ICD-10-CM

## 2016-12-30 DIAGNOSIS — I10 Essential (primary) hypertension: Secondary | ICD-10-CM | POA: Diagnosis not present

## 2016-12-30 DIAGNOSIS — Z1159 Encounter for screening for other viral diseases: Secondary | ICD-10-CM

## 2016-12-30 LAB — COMPREHENSIVE METABOLIC PANEL
ALT: 30 U/L (ref 0–53)
AST: 22 U/L (ref 0–37)
Albumin: 4.3 g/dL (ref 3.5–5.2)
Alkaline Phosphatase: 51 U/L (ref 39–117)
BUN: 13 mg/dL (ref 6–23)
CHLORIDE: 103 meq/L (ref 96–112)
CO2: 30 meq/L (ref 19–32)
CREATININE: 0.89 mg/dL (ref 0.40–1.50)
Calcium: 9.8 mg/dL (ref 8.4–10.5)
GFR: 91.95 mL/min (ref >60.00)
Glucose, Bld: 121 mg/dL — ABNORMAL HIGH (ref 70–99)
Potassium: 4.5 mEq/L (ref 3.5–5.1)
SODIUM: 138 meq/L (ref 135–145)
Total Bilirubin: 0.8 mg/dL (ref 0.2–1.2)
Total Protein: 7.2 g/dL (ref 6.0–8.3)

## 2016-12-30 LAB — HEMOGLOBIN A1C: HEMOGLOBIN A1C: 5.5 % (ref 4.6–6.5)

## 2016-12-30 LAB — URIC ACID: URIC ACID, SERUM: 8.7 mg/dL — AB (ref 4.0–7.8)

## 2016-12-30 MED ORDER — METOPROLOL TARTRATE 100 MG PO TABS: 100.0000 mg | ORAL_TABLET | Freq: Two times a day (BID) | ORAL | 1 refills | Status: DC

## 2016-12-30 MED ORDER — FELODIPINE ER 10 MG PO TB24: 10.0000 mg | ORAL_TABLET | Freq: Every day | ORAL | 1 refills | Status: DC

## 2016-12-30 MED ORDER — LOSARTAN POTASSIUM 100 MG PO TABS: 100.0000 mg | ORAL_TABLET | Freq: Every day | ORAL | 1 refills | Status: DC

## 2016-12-30 NOTE — Progress Notes (Signed)
Subjective:    Patient ID: Clifford Dean, male    DOB: 26-Sep-1956, 61 y.o.   MRN: [id number]  DOS:  12/30/2016 Type of visit - description : rov Interval history: HTN- Good compliance with medication until 4 days ago when he ran out of felodipine. Ambulatory BPs normal. 6 months ago, the right hand got very swollen and painful, went to urgent care, x-rays were done, referred  to a hand doctor, he diagnosed him with gout and provided a local injection. It helped significantly and rather quickly. No more symptoms since then.   Review of Systems Occasional lower extremity edema usually if he sits in his desk for a while  Past Medical History:  Diagnosis Date  . Allergic rhinitis   . Gout   . Hypertension   . Prostate nodule     Bx (-) 2007    Past Surgical History:  Procedure Laterality Date  . cardiolite  2003   negative   . PROSTATE SURGERY  2007   prosate biopsy (-)     Social History   Social History  . Marital status: Married    Spouse name: N/A  . Number of children: 3  . Years of education: N/A   Occupational History  . Cytogeneticist   .  [employer]   Social History Main Topics  . Smoking status: Current Every Day Smoker    Types: Cigarettes  . Smokeless tobacco: Never Used     Comment: 1 pack a week   . Alcohol use 0.0 oz/week     Comment: wine at night   . Drug use: No  . Sexual activity: Not on file   Other Topics Concern  . Not on file   Social History Narrative   Married, 3 children, 1 g-child born 2014    moved to [place]  ~ 6 -2015      Allergies as of 12/30/2016   No Known Allergies     Medication List       Accurate as of 12/30/16 11:59 PM. Always use your most recent med list.          aspirin 81 MG tablet Take 81 mg by mouth daily.   COLCRYS 0.6 MG tablet Generic drug:  colchicine TAKE 1 TABLET BY MOUTH 3 TIMES A DAY AS NEEDED   felodipine 10 MG 24 hr tablet Commonly known as:  PLENDIL Take 1 tablet (10 mg  total) by mouth daily.   indomethacin 50 MG capsule Commonly known as:  INDOCIN Take 1 capsule (50 mg total) by mouth 3 (three) times daily as needed (gout ).   losartan 100 MG tablet Commonly known as:  COZAAR Take 1 tablet (100 mg total) by mouth daily.   metoprolol 100 MG tablet Commonly known as:  LOPRESSOR Take 1 tablet (100 mg total) by mouth 2 (two) times daily.          Objective:   Physical Exam BP 132/78 (BP Location: Left Arm, Patient Position: Sitting, Cuff Size: Normal)   Pulse 85   Temp 98 F (36.7 C) (Oral)   Resp 14   Ht  (1.753 m)   Wt 220 lb 6 oz (100 kg)   SpO2 98%   BMI 32.54 kg/m    General:   Well developed, well nourished . NAD.  HEENT:  Normocephalic . Face symmetric, atraumatic Lungs:  CTA B Normal respiratory effort, no intercostal retractions, no accessory muscle use. Heart: RRR,  no  murmur.  Trace pretibial edema bilaterally  Skin: Not pale. Not jaundice Neurologic:  alert & oriented X3.  Speech normal, gait appropriate for age and unassisted Psych--  Cognition and judgment appear intact.  Cooperative with normal attention span and concentration.  Behavior appropriate. No anxious or depressed appearing.      Assessment & Plan:   Assessment > HTN Dyslipidemia: Mild Gout Prostate nodule bx  (-) 2007 Allergic rhinitis  Plan:  HTN: Check a CMP, continue losartan, Lopressor and Plendil. Refill sent. Gout: Had a single episode 6 months ago, check a uric acid, continue colchicine and Indocin as needed Hyperglycemia: Check up A1c Primary care: He agreed to screen for HIV and hep C. Needs to find a new PCP near home, he is due for a CCS..   Will be happy to see him in 6 months for a physical if he so desires

## 2016-12-30 NOTE — Progress Notes (Signed)
Pre visit review using our clinic review tool, if applicable. No additional management support is needed unless otherwise documented below in the visit note. 

## 2016-12-30 NOTE — Patient Instructions (Signed)
GO TO THE LAB : Get the blood work     Recommend  to get established with a new primary doctor in [facility]. You are due for a colonoscopy.

## 2016-12-31 LAB — HIV ANTIBODY (ROUTINE TESTING W REFLEX): HIV: NONREACTIVE

## 2016-12-31 LAB — HEPATITIS C ANTIBODY: HCV Ab: NEGATIVE

## 2016-12-31 NOTE — Assessment & Plan Note (Signed)
  HTN: Check a CMP, continue losartan, Lopressor and Plendil. Refill sent. Gout: Had a single episode 6 months ago, check a uric acid, continue colchicine and Indocin as needed Hyperglycemia: Check up A1c Primary care: He agreed to screen for HIV and hep C. Needs to find a new PCP near home, he is due for a CCS..   Will be happy to see him in 6 months for a physical if he so desires

## 2017-02-20 ENCOUNTER — Other Ambulatory Visit: Payer: Self-pay | Admitting: Internal Medicine

## 2017-03-04 ENCOUNTER — Other Ambulatory Visit: Payer: Self-pay | Admitting: Medical

## 2017-03-04 ENCOUNTER — Encounter: Payer: Self-pay | Admitting: Internal Medicine

## 2017-03-04 MED ORDER — INDOMETHACIN 50 MG PO CAPS: 50.0000 mg | ORAL_CAPSULE | Freq: Three times a day (TID) | ORAL | 0 refills | Status: AC | PRN

## 2017-04-18 ENCOUNTER — Other Ambulatory Visit: Payer: Self-pay | Admitting: Internal Medicine

## 2017-06-21 ENCOUNTER — Other Ambulatory Visit: Payer: Self-pay | Admitting: Internal Medicine

## 2017-07-16 ENCOUNTER — Encounter: Payer: Self-pay | Admitting: Internal Medicine

## 2017-07-16 ENCOUNTER — Ambulatory Visit (INDEPENDENT_AMBULATORY_CARE_PROVIDER_SITE_OTHER): Payer: BLUE CROSS/BLUE SHIELD | Admitting: Internal Medicine

## 2017-07-16 VITALS — BP 134/72 | HR 82 | Temp 98.0°F | Resp 14 | Ht 69.0 in | Wt 221.5 lb

## 2017-07-16 DIAGNOSIS — R739 Hyperglycemia, unspecified: Secondary | ICD-10-CM | POA: Diagnosis not present

## 2017-07-16 DIAGNOSIS — Z125 Encounter for screening for malignant neoplasm of prostate: Secondary | ICD-10-CM | POA: Diagnosis not present

## 2017-07-16 DIAGNOSIS — Z0000 Encounter for general adult medical examination without abnormal findings: Secondary | ICD-10-CM

## 2017-07-16 DIAGNOSIS — H6120 Impacted cerumen, unspecified ear: Secondary | ICD-10-CM

## 2017-07-16 LAB — LIPID PANEL
CHOL/HDL RATIO: 3
Cholesterol: 203 mg/dL — ABNORMAL HIGH (ref 0–200)
HDL: 66.4 mg/dL (ref >39.00)
LDL CALC: 102 mg/dL — AB (ref 0–99)
NonHDL: 136.27
TRIGLYCERIDES: 172 mg/dL — AB (ref 0.0–149.0)
VLDL: 34.4 mg/dL (ref 0.0–40.0)

## 2017-07-16 LAB — HEMOGLOBIN A1C: HEMOGLOBIN A1C: 5.4 % (ref 4.6–6.5)

## 2017-07-16 LAB — CBC WITH DIFFERENTIAL/PLATELET
BASOS ABS: 0.1 10*3/uL (ref 0.0–0.1)
BASOS PCT: 0.8 % (ref 0.0–3.0)
EOS ABS: 0.4 10*3/uL (ref 0.0–0.7)
Eosinophils Relative: 3.6 % (ref 0.0–5.0)
HCT: 48.1 % (ref 39.0–52.0)
Hemoglobin: 15.9 g/dL (ref 13.0–17.0)
LYMPHS PCT: 29.4 % (ref 12.0–46.0)
Lymphs Abs: 3.1 10*3/uL (ref 0.7–4.0)
MCHC: 33 g/dL (ref 30.0–36.0)
MCV: 94.5 fl (ref 78.0–100.0)
MONO ABS: 0.9 10*3/uL (ref 0.1–1.0)
Monocytes Relative: 8.2 % (ref 3.0–12.0)
NEUTROS ABS: 6 10*3/uL (ref 1.4–7.7)
Neutrophils Relative %: 58 % (ref 43.0–77.0)
PLATELETS: 247 10*3/uL (ref 150.0–400.0)
RBC: 5.09 Mil/uL (ref 4.22–5.81)
RDW: 13.8 % (ref 11.5–15.5)
WBC: 10.4 10*3/uL (ref 4.0–10.5)

## 2017-07-16 LAB — BASIC METABOLIC PANEL
BUN: 12 mg/dL (ref 6–23)
CHLORIDE: 100 meq/L (ref 96–112)
CO2: 32 meq/L (ref 19–32)
CREATININE: 0.85 mg/dL (ref 0.40–1.50)
Calcium: 10.1 mg/dL (ref 8.4–10.5)
GFR: 96.79 mL/min (ref >60.00)
Glucose, Bld: 103 mg/dL — ABNORMAL HIGH (ref 70–99)
POTASSIUM: 4.5 meq/L (ref 3.5–5.1)
Sodium: 139 mEq/L (ref 135–145)

## 2017-07-16 LAB — PSA: PSA: 1.27 ng/mL (ref 0.10–4.00)

## 2017-07-16 LAB — TSH: TSH: 1.55 u[IU]/mL (ref 0.35–4.50)

## 2017-07-16 NOTE — Patient Instructions (Signed)
GO TO THE LAB : Get the blood work     GO TO THE FRONT DESK Schedule your next appointment for a  Check up in 6 months   

## 2017-07-16 NOTE — Assessment & Plan Note (Addendum)
-   Td 2013, zostavax 2014; shingrix discussed ; declined flu shot -CCS: iFOB, cscope pro-cons discussed , elected iFOB but will think about a cscope  -Prostate cancer screening: See comments under prostate nodule -Diet exercise discussed - Labs: BMP, FLP, CBC, A1c, TSH, PSA, Hemoccult

## 2017-07-16 NOTE — Progress Notes (Signed)
Subjective:    Patient ID: Clifford Dean, male    DOB: [birth year], 61 y.o.   MRN: [id number]  DOS:  07/16/2017 Type of visit - description : cpx Interval history: No major concerns   Review of Systems  Right ear pressure on and off, worse for the last few days. Long history of right hand knuckle swelling, for years, has seen the specialist a couple of years ago, he apparently provided a local injection with relief.  Other than above, a 14 point review of systems is negative    Past Medical History:  Diagnosis Date  . Allergic rhinitis   . Gout   . Hypertension   . Prostate nodule     Bx (-) 2007    Past Surgical History:  Procedure Laterality Date  . cardiolite  2003   negative   . PROSTATE SURGERY  2007   prosate biopsy (-)     Social History   Social History  . Marital status: Married    Spouse name: N/A  . Number of children: 3  . Years of education: N/A   Occupational History  . 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  [employer]   Social History Main Topics  . Smoking status: Current Every Day Smoker    Types: Cigarettes  . Smokeless tobacco: Never Used     Comment: 1 pack a week   . Alcohol use 0.0 oz/week     Comment: wine at night   . Drug use: No  . Sexual activity: Not on file   Other Topics Concern  . Not on file   Social History Narrative   Married, 3 children, 1 g-child born 2014   moved to [place] ~ 6 -2015     Family History  Problem Relation Age of Onset  . Hypertension Mother   . Glaucoma Mother   . Autoimmune disease Mother   . Colon cancer Neg Hx   . Prostate cancer Neg Hx   . Heart attack Neg Hx   . Diabetes Neg Hx   . Stroke Neg Hx      Allergies as of 07/16/2017   No Known Allergies     Medication List       Accurate as of 07/16/17  6:26 PM. Always use your most recent med list.          aspirin 81 MG tablet Take 81 mg by mouth daily.   COLCRYS 0.6 MG tablet Generic drug:  colchicine Take 1 tablet (0.6 mg  total) by mouth 3 (three) times daily as needed.   felodipine 10 MG 24 hr tablet Commonly known as:  PLENDIL Take 1 tablet (10 mg total) by mouth daily.   indomethacin 50 MG capsule Commonly known as:  INDOCIN Take 1 capsule (50 mg total) by mouth 3 (three) times daily as needed (gout ).   losartan 100 MG tablet Commonly known as:  COZAAR Take 1 tablet (100 mg total) by mouth daily.   metoprolol tartrate 100 MG tablet Commonly known as:  LOPRESSOR Take 1 tablet (100 mg total) by mouth 2 (two) times daily.          Objective:   Physical Exam BP 134/72 (BP Location: Left Arm, Patient Position: Sitting, Cuff Size: Normal)   Pulse 82   Temp 98 F (36.7 C) (Oral)   Resp 14   Ht 5\' 9"  (1.753 m)   Wt 221 lb 8 oz (100.5 kg)   SpO2 98%  BMI 32.71 kg/m   General:   Well developed, well nourished . NAD.  Neck: No  thyromegaly  HEENT:  Normocephalic . Face symmetric, atraumatic. Ears: Small amount of wax on the left. Abundant, packed wax on the right. Lungs:  CTA B Normal respiratory effort, no intercostal retractions, no accessory muscle use. Heart: RRR,  no murmur.  No pretibial edema bilaterally  Abdomen:  Not distended, soft, non-tender. No rebound or rigidity.   GU: Prostate size is normal, soft, nontender. Has a proximal, 3-4 mm nodule, it is not hard, nontender. MSK: Hands with changes consistent with DJD and few synovial cyst. The fifth right knuckle  seems slightly puffy but not red or tender to palpation. Skin: Exposed areas without rash. Not pale. Not jaundice Neurologic:  alert & oriented X3.  Speech normal, gait appropriate for age and unassisted Strength symmetric and appropriate for age.  Psych: Cognition and judgment appear intact.  Cooperative with normal attention span and concentration.  Behavior appropriate. No anxious or depressed appearing.    Assessment & Plan:   Assessment   HTN Dyslipidemia: Mild Gout Prostate nodule bx  (-) 2007, PSA  3.0 2008 Allergic rhinitis  Plan:  HTN: on CCBs, losartan, metoprolol. Checking labs Dyslipidemia: Diet control checking labs Prostate nodule: Had a biopsy in 2007, negative. Has not seen urology in a while, exam show 3-4 mm nodule. Check a PSA. Low threshold for  Urology referral Cerumen impaction: Partially removed by spoon, extensive lavage performed, attempted removal w/ forceps as well. At the end there was remaining  wax. Recommend H2 O2 for 10 days then further lavage at at home, urgent care or here (lives out of town). There was some bleeding, he will call me if he has swelling, pain or discharge..  Gout: Last uric acid is slightly elevated but no acute episodes. DJD hands: Observation. RTC 6 months

## 2017-07-16 NOTE — Progress Notes (Signed)
Pre visit review using our clinic review tool, if applicable. No additional management support is needed unless otherwise documented below in the visit note. 

## 2017-07-16 NOTE — Assessment & Plan Note (Signed)
HTN: on CCBs, losartan, metoprolol. Checking labs Dyslipidemia: Diet control checking labs Prostate nodule: Had a biopsy in 2007, negative. Has not seen urology in a while, exam show 3-4 mm nodule. Check a PSA. Low threshold for  Urology referral Cerumen impaction: Partially removed by spoon, extensive lavage performed, attempted removal w/ forceps as well. At the end there was remaining  wax. Recommend H2 O2 for 10 days then further lavage at at home, urgent care or here (lives out of town). There was some bleeding, he will call me if he has swelling, pain or discharge..  Gout: Last uric acid is slightly elevated but no acute episodes. DJD hands: Observation. RTC 6 months

## 2017-09-04 DIAGNOSIS — H10023 Other mucopurulent conjunctivitis, bilateral: Secondary | ICD-10-CM | POA: Diagnosis not present

## 2017-09-11 IMAGING — DX DG WRIST COMPLETE 3+V*L*
4 series · 4 of 4 positions shown · non-contrast
Comparison: No recent prior.

CLINICAL DATA: Left wrist pain. No known injury. Initial evaluation
.

EXAM:
LEFT WRIST - COMPLETE 3+ VIEW

[wrist pa]
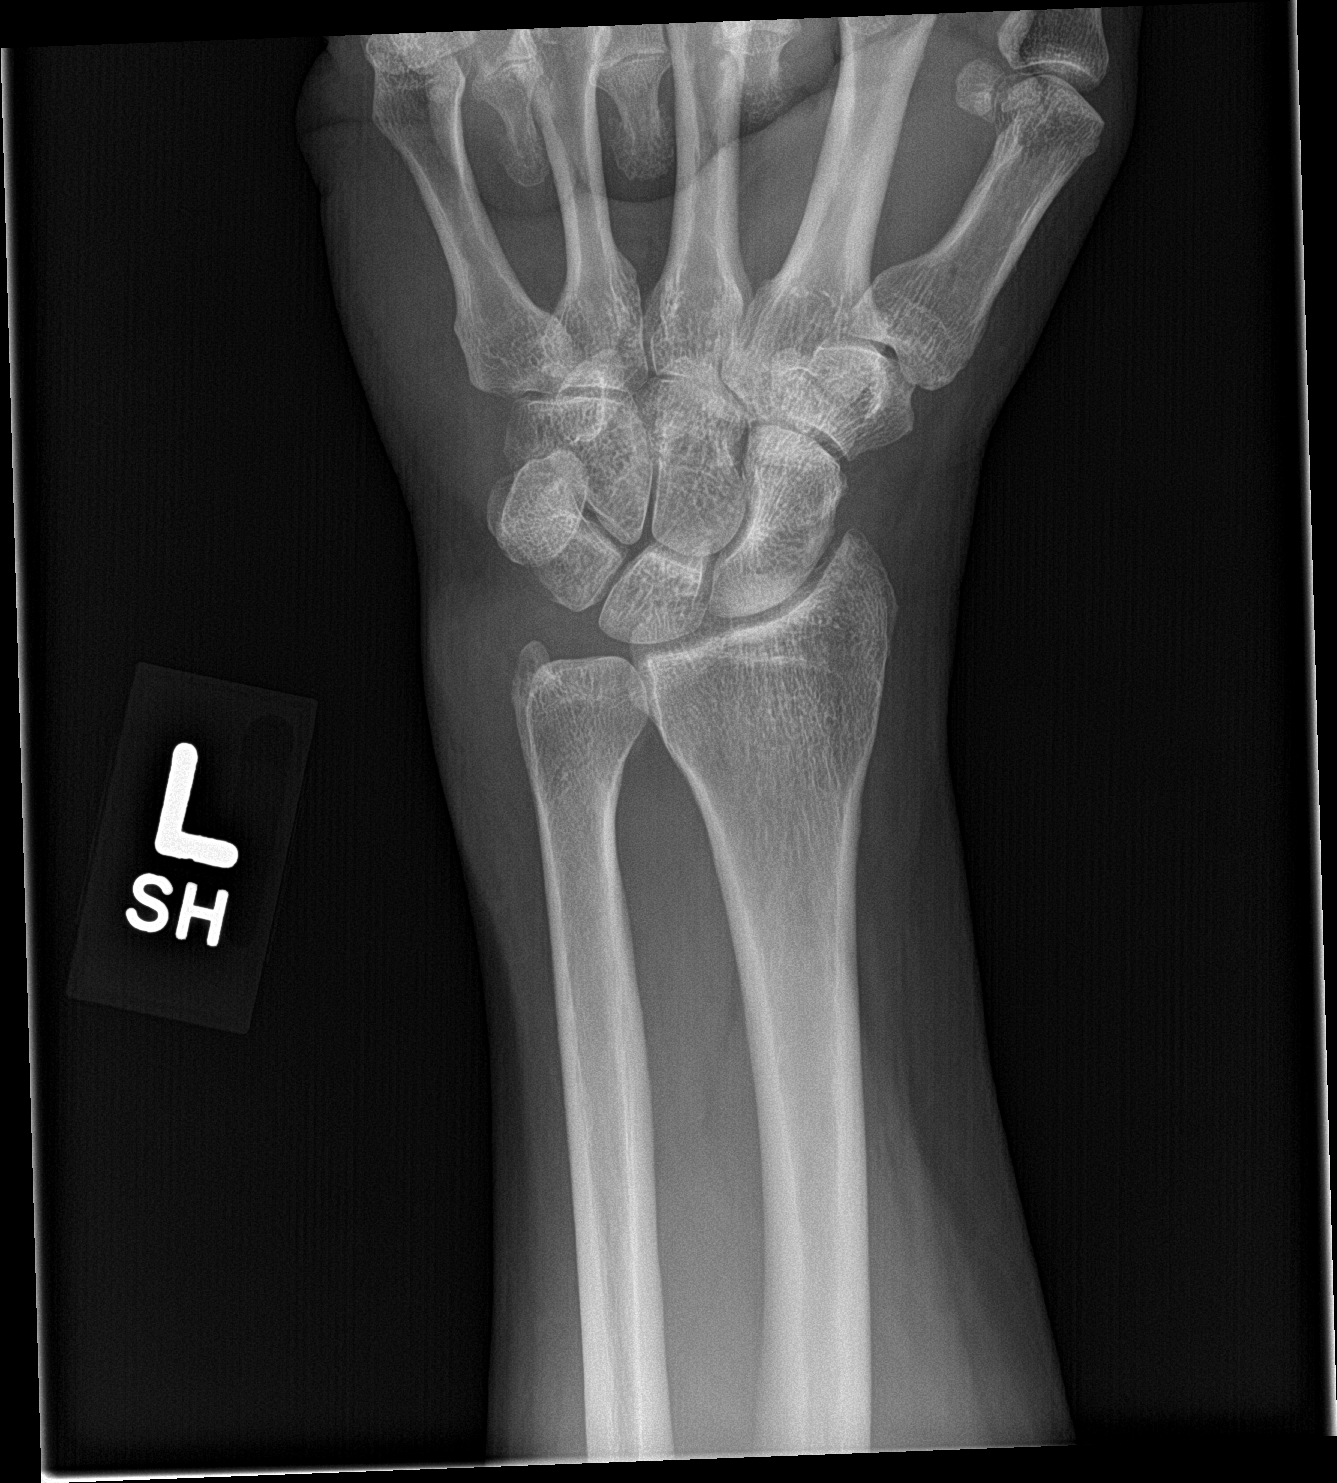

[wrist obl]
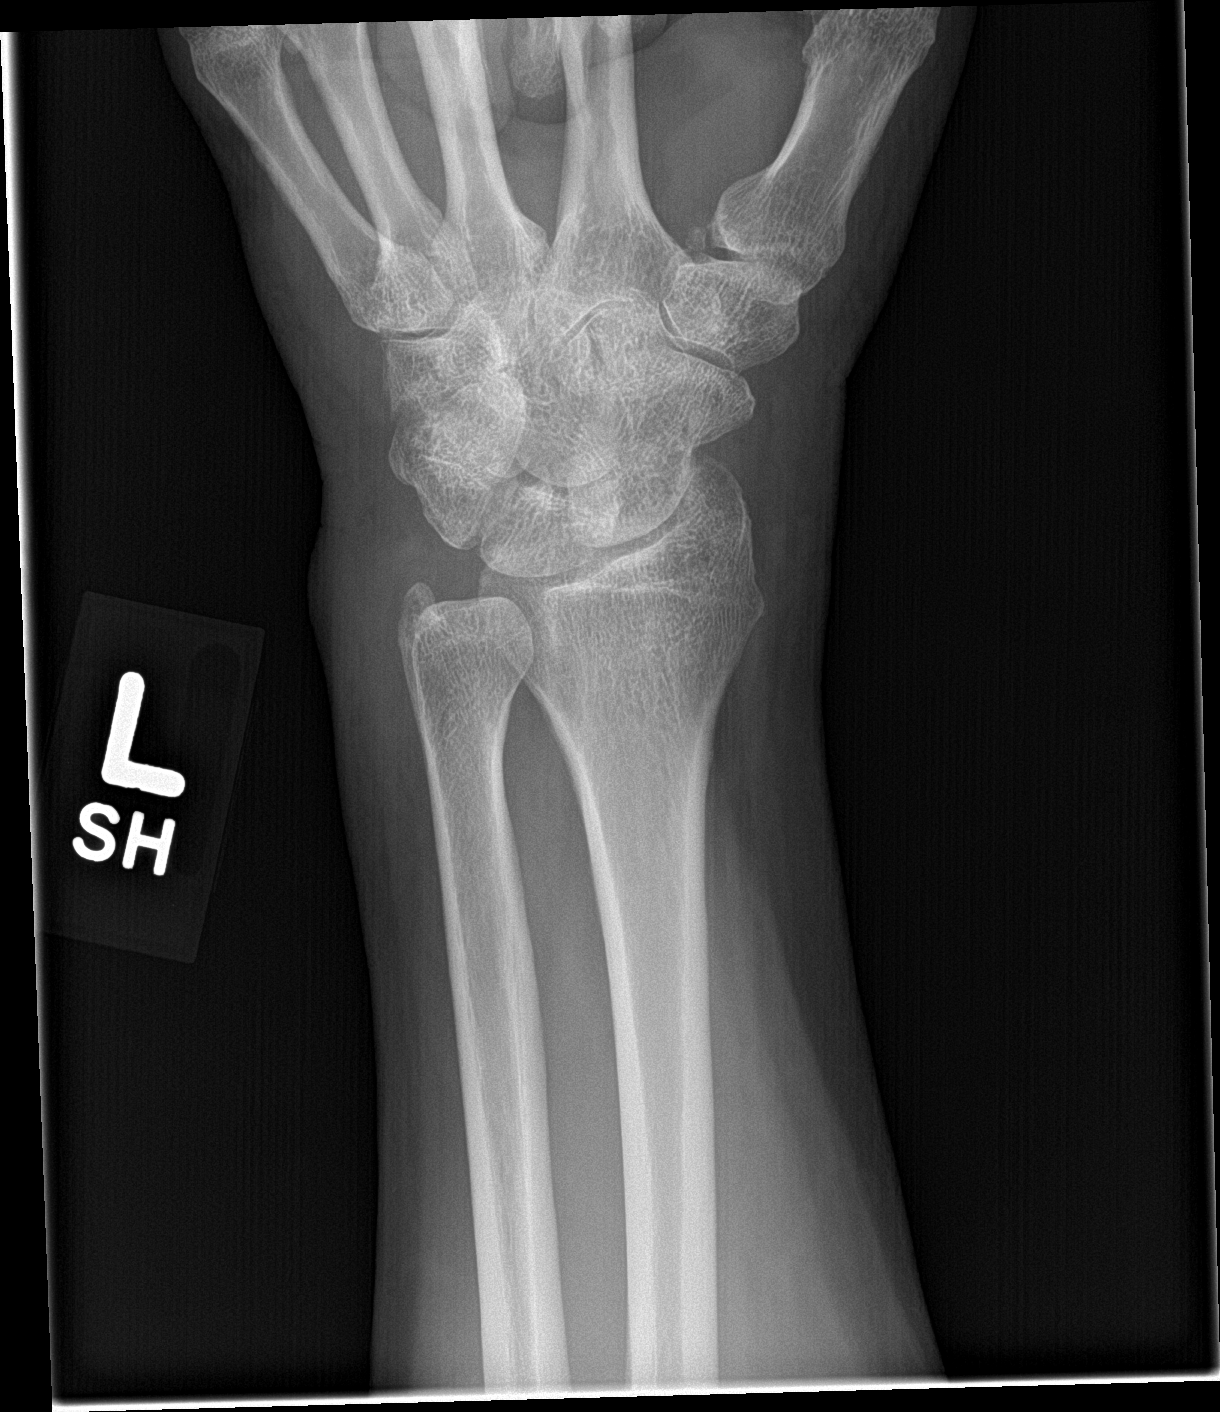

[wrist lat]
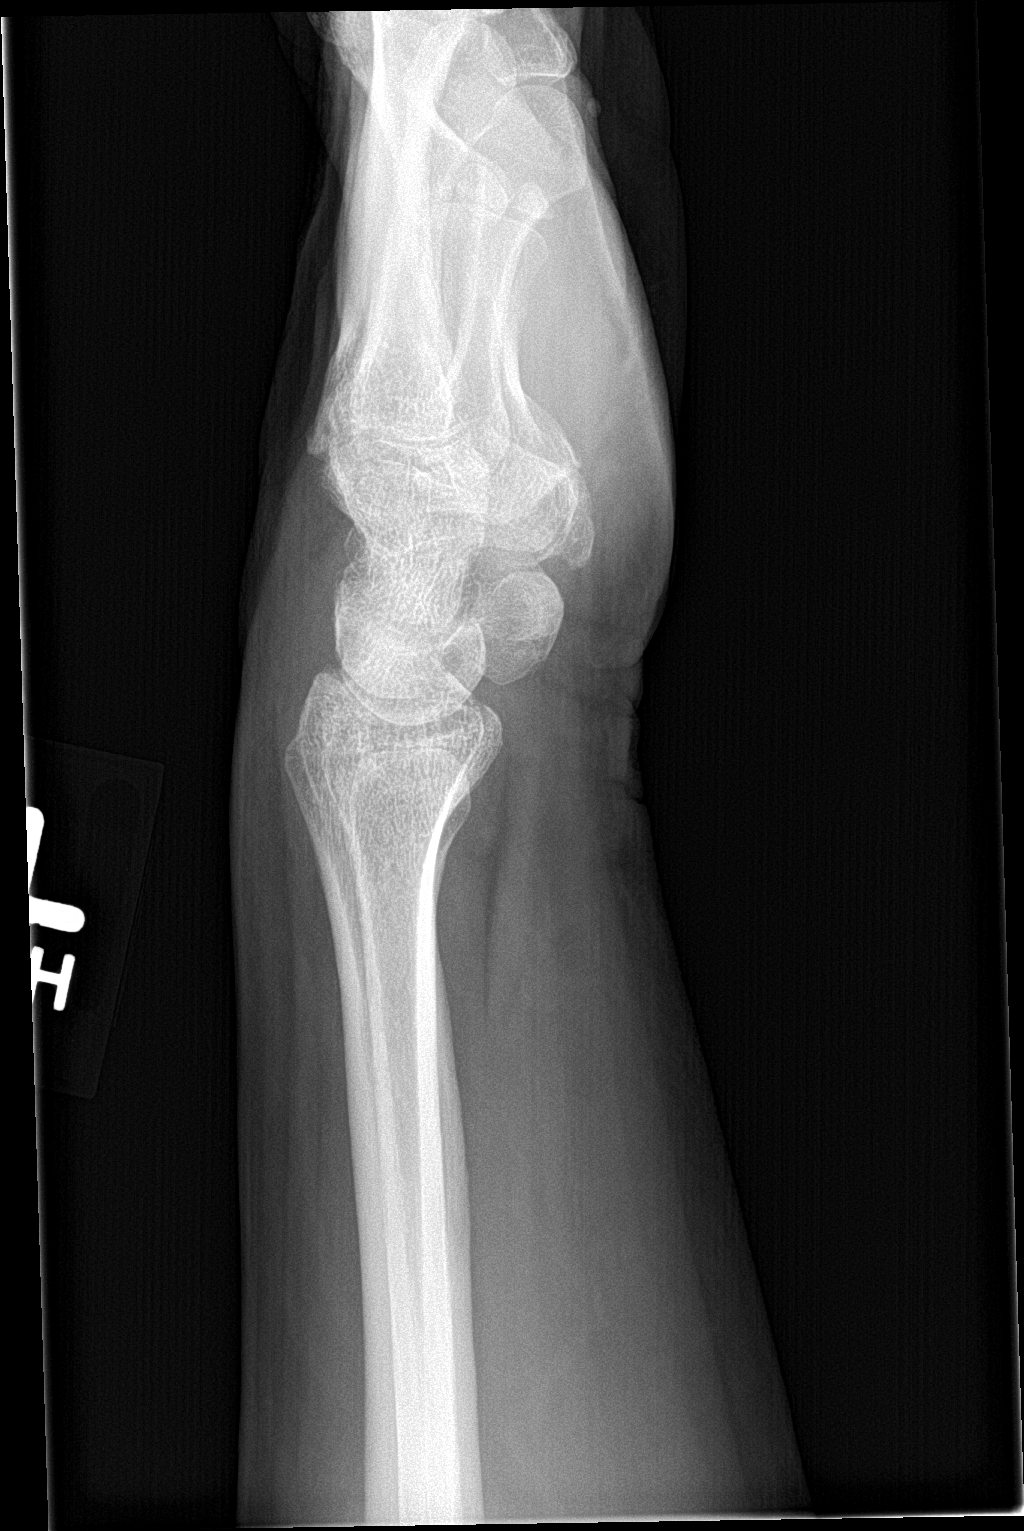

[wrist navicular]
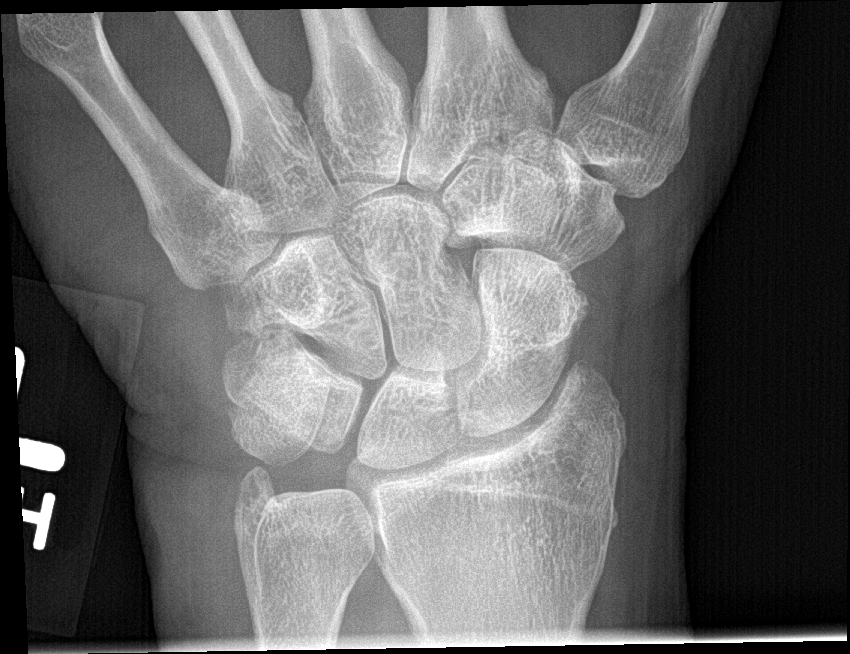

[4 of 4 positions shown; findings below may reference images not displayed]

FINDINGS: No acute bony or joint abnormality identified. No evidence of
fracture dislocation.
IMPRESSION: No acute abnormality.  Mild diffuse degenerative change.

## 2017-09-18 ENCOUNTER — Other Ambulatory Visit: Payer: Self-pay | Admitting: Internal Medicine

## 2017-11-28 ENCOUNTER — Other Ambulatory Visit: Payer: Self-pay | Admitting: Internal Medicine

## 2018-03-18 ENCOUNTER — Other Ambulatory Visit: Payer: Self-pay | Admitting: Internal Medicine

## 2018-04-18 ENCOUNTER — Other Ambulatory Visit: Payer: Self-pay | Admitting: Internal Medicine

## 2018-04-24 ENCOUNTER — Other Ambulatory Visit: Payer: Self-pay | Admitting: Internal Medicine

## 2018-05-11 ENCOUNTER — Encounter: Payer: Self-pay | Admitting: Internal Medicine

## 2018-05-25 ENCOUNTER — Other Ambulatory Visit: Payer: Self-pay | Admitting: Internal Medicine

## 2018-06-12 ENCOUNTER — Other Ambulatory Visit: Payer: Self-pay | Admitting: Internal Medicine

## 2018-06-17 ENCOUNTER — Telehealth: Payer: Self-pay | Admitting: Internal Medicine

## 2018-06-17 MED ORDER — COLCRYS 0.6 MG PO TABS: 0.6000 mg | ORAL_TABLET | Freq: Three times a day (TID) | ORAL | 0 refills | Status: AC | PRN

## 2018-06-17 MED ORDER — METOPROLOL TARTRATE 100 MG PO TABS: 100.0000 mg | ORAL_TABLET | Freq: Two times a day (BID) | ORAL | 0 refills | Status: AC

## 2018-06-17 MED ORDER — FELODIPINE ER 10 MG PO TB24: 10.0000 mg | ORAL_TABLET | Freq: Every day | ORAL | 0 refills | Status: AC

## 2018-06-17 MED ORDER — LOSARTAN POTASSIUM 100 MG PO TABS: 100.0000 mg | ORAL_TABLET | Freq: Every day | ORAL | 0 refills | Status: AC

## 2018-06-17 NOTE — Addendum Note (Signed)
Addended byConrad Conway: Kjerstin Abrigo D on: 06/17/2018 02:56 PM   Modules accepted: Orders

## 2018-06-17 NOTE — Telephone Encounter (Signed)
Send 1 month supply of metoprolol, losartan, Plendil and Colcrys.  No refills.   Also send a MyChart message:  Maurine MinisterDennis,  we are sending a month supply of your medicines today, however we will not be able to continue refilling the meds without a office visit. Please find a  primary doctor in your new location.

## 2018-06-17 NOTE — Telephone Encounter (Signed)
Rx's sent. Mychart message sent.

## 2018-07-15 ENCOUNTER — Other Ambulatory Visit: Payer: Self-pay | Admitting: Internal Medicine

## 2018-07-15 DIAGNOSIS — I10 Essential (primary) hypertension: Secondary | ICD-10-CM | POA: Diagnosis not present

## 2018-07-15 DIAGNOSIS — M109 Gout, unspecified: Secondary | ICD-10-CM | POA: Diagnosis not present

## 2018-07-15 DIAGNOSIS — E782 Mixed hyperlipidemia: Secondary | ICD-10-CM | POA: Diagnosis not present

## 2018-07-15 DIAGNOSIS — R0609 Other forms of dyspnea: Secondary | ICD-10-CM | POA: Diagnosis not present

## 2018-07-15 DIAGNOSIS — M5416 Radiculopathy, lumbar region: Secondary | ICD-10-CM | POA: Diagnosis not present

## 2018-07-15 DIAGNOSIS — Z125 Encounter for screening for malignant neoplasm of prostate: Secondary | ICD-10-CM | POA: Diagnosis not present

## 2018-07-15 DIAGNOSIS — Z0000 Encounter for general adult medical examination without abnormal findings: Secondary | ICD-10-CM | POA: Diagnosis not present

## 2018-08-09 DIAGNOSIS — R0609 Other forms of dyspnea: Secondary | ICD-10-CM | POA: Diagnosis not present

## 2018-08-09 DIAGNOSIS — I351 Nonrheumatic aortic (valve) insufficiency: Secondary | ICD-10-CM | POA: Diagnosis not present

## 2018-08-09 DIAGNOSIS — I361 Nonrheumatic tricuspid (valve) insufficiency: Secondary | ICD-10-CM | POA: Diagnosis not present

## 2018-08-09 DIAGNOSIS — I082 Rheumatic disorders of both aortic and tricuspid valves: Secondary | ICD-10-CM | POA: Diagnosis not present

## 2018-08-10 ENCOUNTER — Encounter: Payer: Self-pay | Admitting: Internal Medicine

## 2018-08-14 ENCOUNTER — Other Ambulatory Visit: Payer: Self-pay | Admitting: Internal Medicine

## 2018-09-11 ENCOUNTER — Other Ambulatory Visit: Payer: Self-pay | Admitting: Internal Medicine

## 2019-08-10 DIAGNOSIS — Z0000 Encounter for general adult medical examination without abnormal findings: Secondary | ICD-10-CM | POA: Diagnosis not present

## 2019-08-10 DIAGNOSIS — M5417 Radiculopathy, lumbosacral region: Secondary | ICD-10-CM | POA: Diagnosis not present

## 2019-08-10 DIAGNOSIS — M109 Gout, unspecified: Secondary | ICD-10-CM | POA: Diagnosis not present

## 2019-08-10 DIAGNOSIS — Z6832 Body mass index (BMI) 32.0-32.9, adult: Secondary | ICD-10-CM | POA: Diagnosis not present

## 2019-08-15 DIAGNOSIS — M47816 Spondylosis without myelopathy or radiculopathy, lumbar region: Secondary | ICD-10-CM | POA: Diagnosis not present

## 2019-08-15 DIAGNOSIS — M545 Low back pain: Secondary | ICD-10-CM | POA: Diagnosis not present

## 2019-08-15 DIAGNOSIS — M5136 Other intervertebral disc degeneration, lumbar region: Secondary | ICD-10-CM | POA: Diagnosis not present

## 2019-09-08 DIAGNOSIS — M47816 Spondylosis without myelopathy or radiculopathy, lumbar region: Secondary | ICD-10-CM | POA: Diagnosis not present

## 2019-09-08 DIAGNOSIS — M6281 Muscle weakness (generalized): Secondary | ICD-10-CM | POA: Diagnosis not present

## 2019-09-08 DIAGNOSIS — M5136 Other intervertebral disc degeneration, lumbar region: Secondary | ICD-10-CM | POA: Diagnosis not present

## 2019-09-08 DIAGNOSIS — M545 Low back pain: Secondary | ICD-10-CM | POA: Diagnosis not present

## 2019-09-13 DIAGNOSIS — M6281 Muscle weakness (generalized): Secondary | ICD-10-CM | POA: Diagnosis not present

## 2019-09-22 DIAGNOSIS — M6281 Muscle weakness (generalized): Secondary | ICD-10-CM | POA: Diagnosis not present

## 2019-09-22 DIAGNOSIS — M545 Low back pain: Secondary | ICD-10-CM | POA: Diagnosis not present

## 2019-09-27 DIAGNOSIS — M545 Low back pain: Secondary | ICD-10-CM | POA: Diagnosis not present

## 2019-09-27 DIAGNOSIS — M6281 Muscle weakness (generalized): Secondary | ICD-10-CM | POA: Diagnosis not present
